# Patient Record
Sex: Male | Born: 1965 | Race: White | Hispanic: No | Marital: Married | Smoking: Former smoker
Health system: Southern US, Community
[De-identification: ages and names within clinical notes are randomized; demographics above are authoritative.]

## PROBLEM LIST (undated history)

## (undated) DIAGNOSIS — M7520 Bicipital tendinitis, unspecified shoulder: Secondary | ICD-10-CM

## (undated) DIAGNOSIS — E785 Hyperlipidemia, unspecified: Secondary | ICD-10-CM

## (undated) DIAGNOSIS — G4730 Sleep apnea, unspecified: Secondary | ICD-10-CM

## (undated) DIAGNOSIS — N200 Calculus of kidney: Secondary | ICD-10-CM

## (undated) DIAGNOSIS — R51 Headache: Secondary | ICD-10-CM

## (undated) DIAGNOSIS — K635 Polyp of colon: Secondary | ICD-10-CM

## (undated) DIAGNOSIS — R519 Headache, unspecified: Secondary | ICD-10-CM

## (undated) HISTORY — PX: LITHOTRIPSY: SUR834

## (undated) HISTORY — PX: URETEROSCOPY WITH HOLMIUM LASER LITHOTRIPSY: SHX6645

---

## 2009-08-19 ENCOUNTER — Ambulatory Visit: Payer: Self-pay | Admitting: Internal Medicine

## 2009-08-27 ENCOUNTER — Ambulatory Visit: Payer: Self-pay | Admitting: Urology

## 2009-09-09 ENCOUNTER — Ambulatory Visit: Payer: Self-pay | Admitting: General Practice

## 2009-09-11 ENCOUNTER — Ambulatory Visit: Payer: Self-pay | Admitting: Urology

## 2009-09-30 ENCOUNTER — Ambulatory Visit: Payer: Self-pay | Admitting: Urology

## 2009-11-21 ENCOUNTER — Ambulatory Visit: Payer: Self-pay | Admitting: Urology

## 2009-12-24 ENCOUNTER — Ambulatory Visit: Payer: Self-pay | Admitting: Urology

## 2009-12-26 ENCOUNTER — Ambulatory Visit: Payer: Self-pay | Admitting: Urology

## 2009-12-29 ENCOUNTER — Ambulatory Visit: Payer: Self-pay | Admitting: Urology

## 2010-01-02 ENCOUNTER — Ambulatory Visit: Payer: Self-pay | Admitting: Urology

## 2010-11-17 IMAGING — CR DG ABDOMEN 1V
1 series · 2 of 2 positions shown · non-contrast
Comparison: none

REASON FOR EXAM: nephrolithasis
COMMENTS:

[Series 1: view not recorded · 0.17mm/px · 2 of 2 slices shown]
[im 1/2]
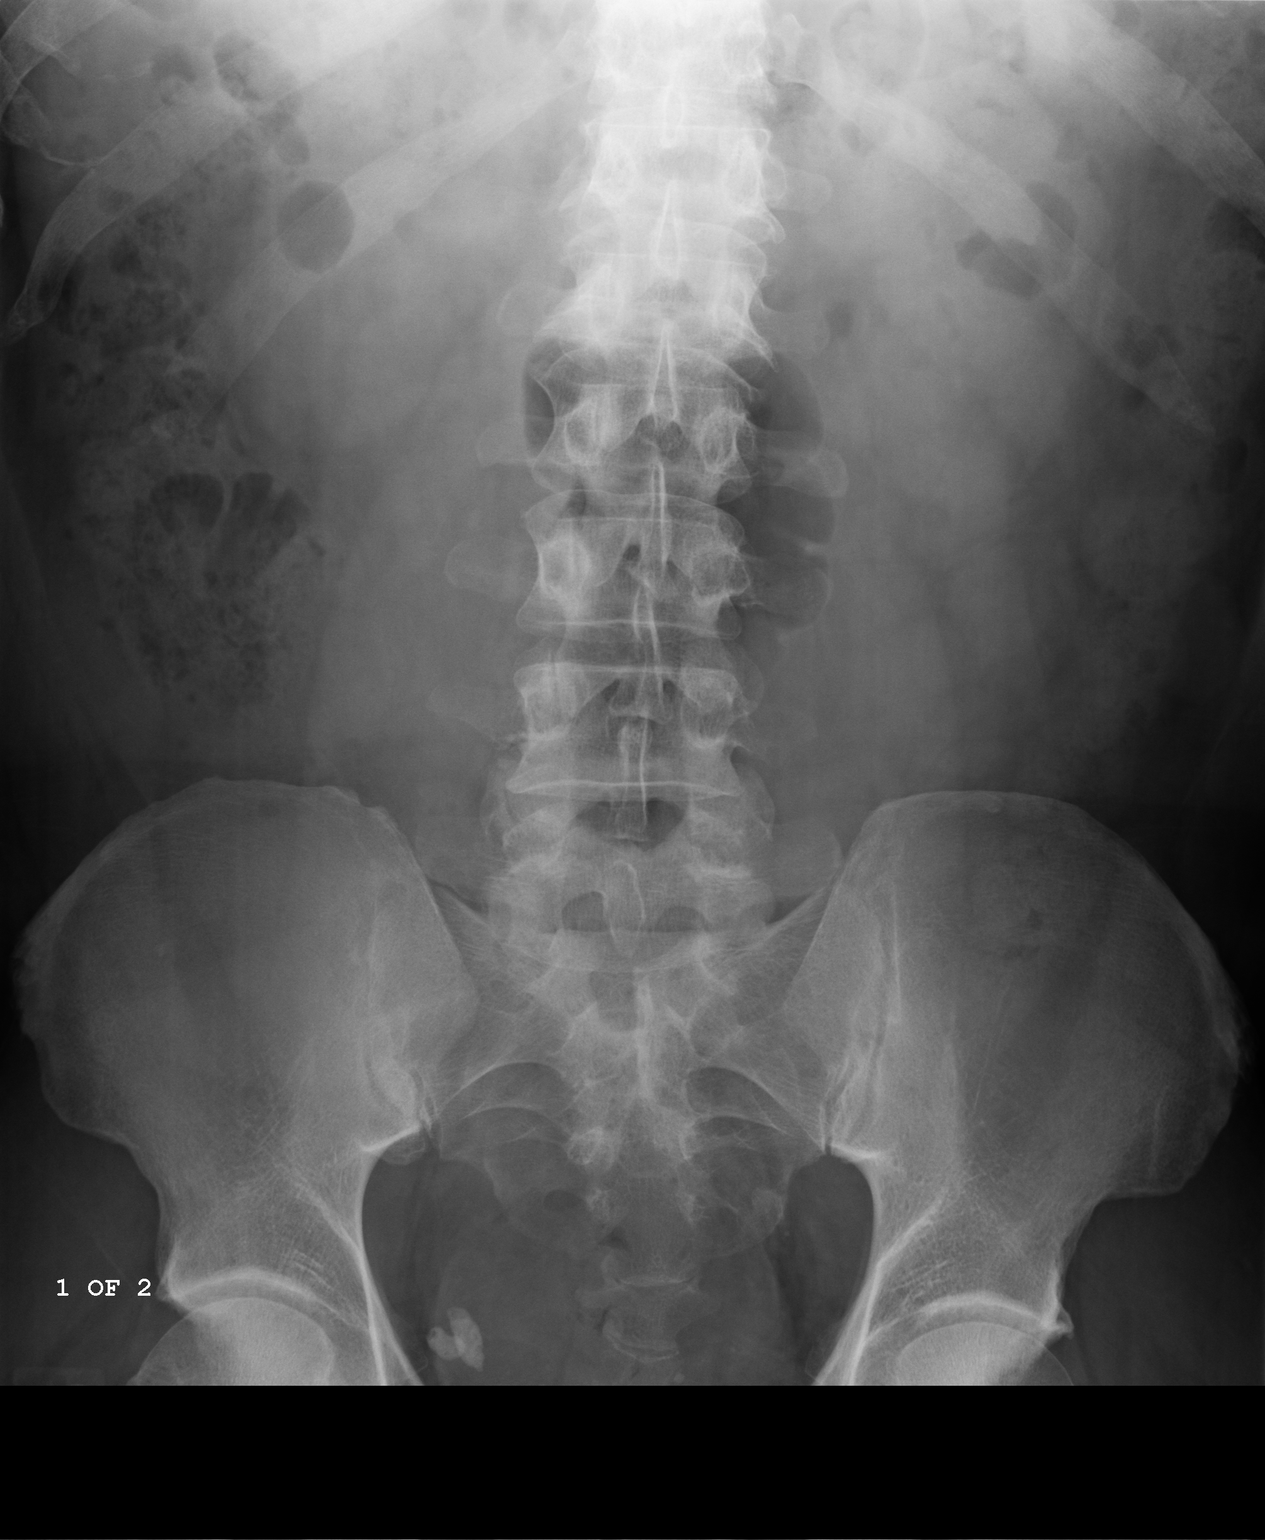
[im 2/2]
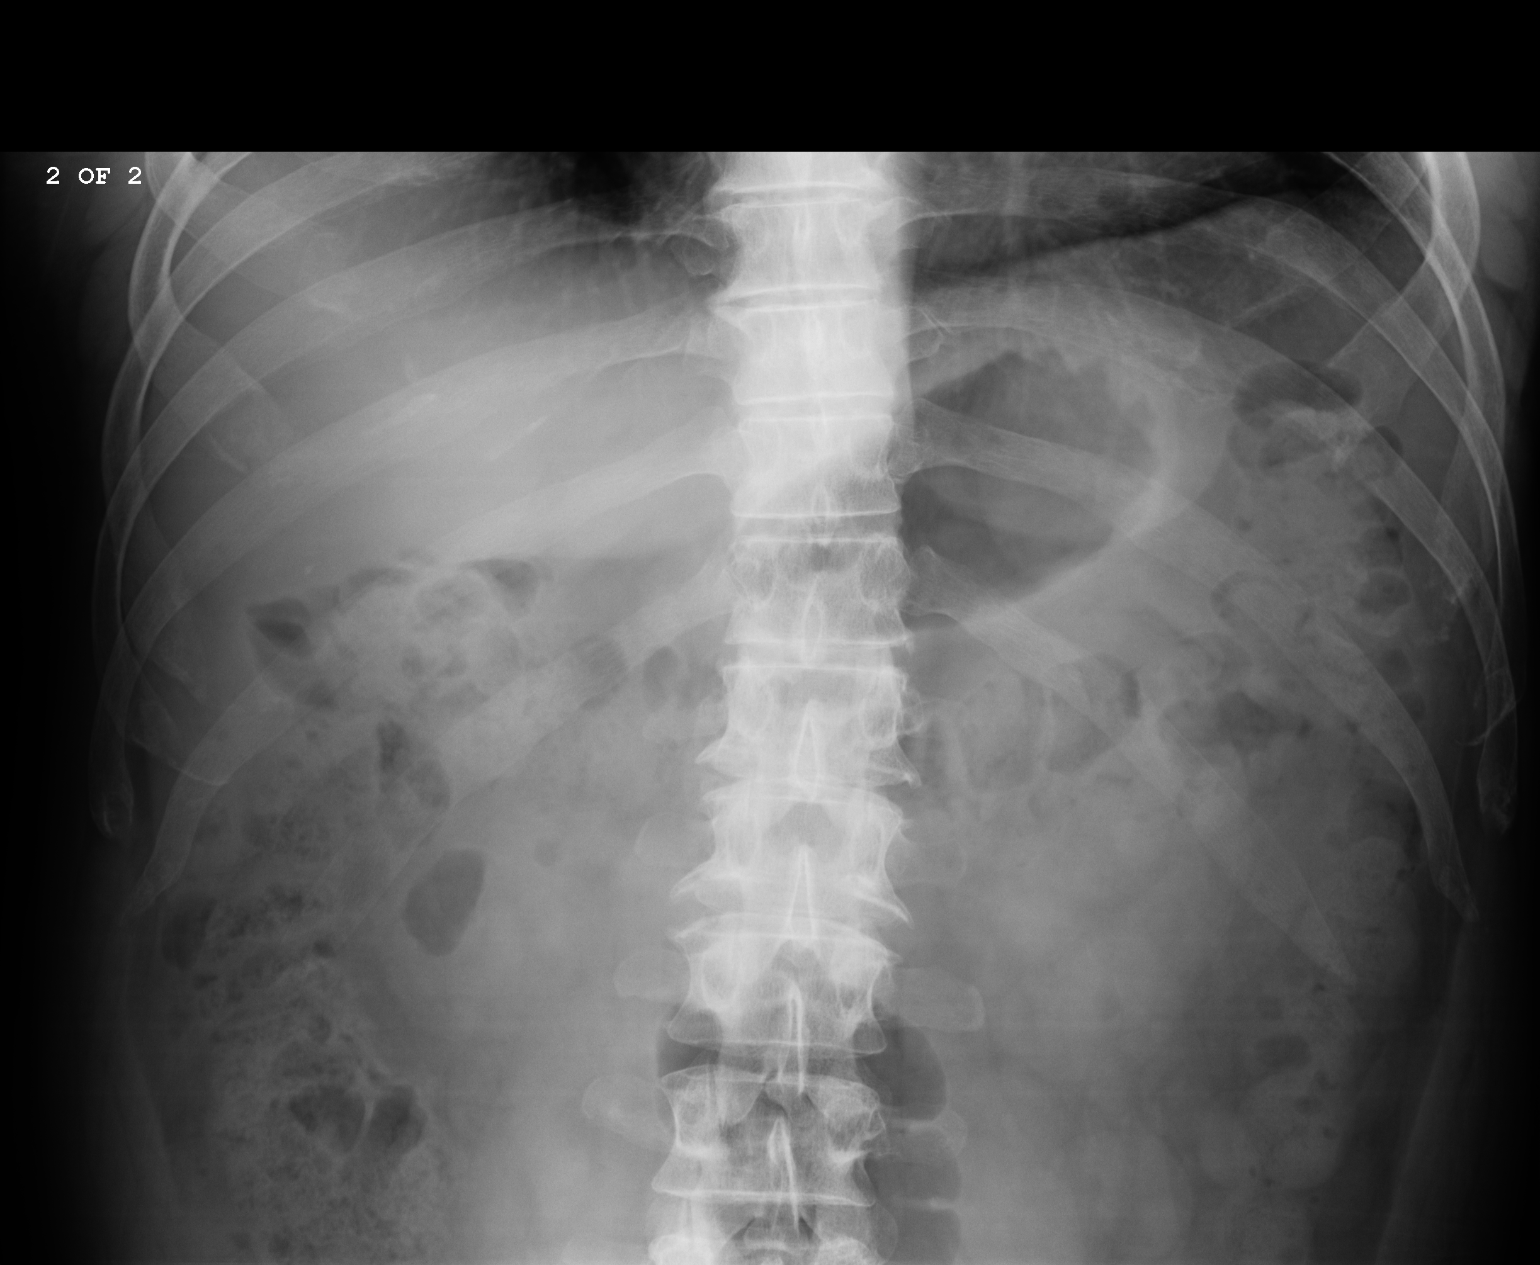

[2 of 2 positions shown; findings below may reference images not displayed]

PROCEDURE:     DXR - DXR KIDNEY URETER BLADDER  - September 30, 2009  [DATE]

RESULT:     Calcifications over the lower mid pelvis and right lower
quadrant are stable. There is no nephrolithiasis evident. Degenerative
changes are seen in the thoracolumbar region. There appears to be a possible
mid right ureteral calculus between the transverse processes of the L2-L3
level. This could represent superimposed artifact.
IMPRESSION: Cannot exclude a right mid ureteral calculus. Correlate clinically. A distal
right ureteral calculus would also be difficult to completely exclude. There
are numerous pelvic calcifications present one of which in the distal right
ureter region may be new.

## 2011-02-12 IMAGING — CT CT ABD-PELV W/O CM
1 of 2 series · 15 of 32 positions shown, 19 images · non-contrast
Comparison: none

REASON FOR EXAM: renal colic
COMMENTS:

PROCEDURE:     KCT - KCT ABDOMEN/PELVIS WO  - December 26, 2009 [DATE]
RESULT:     Comparison is made to a prior study dated 08/19/2009.
TECHNIQUE: Helical noncontrast 3 mm sections were obtained from the lung
bases through the pubic symphysis.

[Series 2: stone 3.0 i40f · axial · 0.93mm/px · z∈[-651,-195]mm · 15 of 172 slices shown, 19 images]
[im 13/172  soft-tissue]
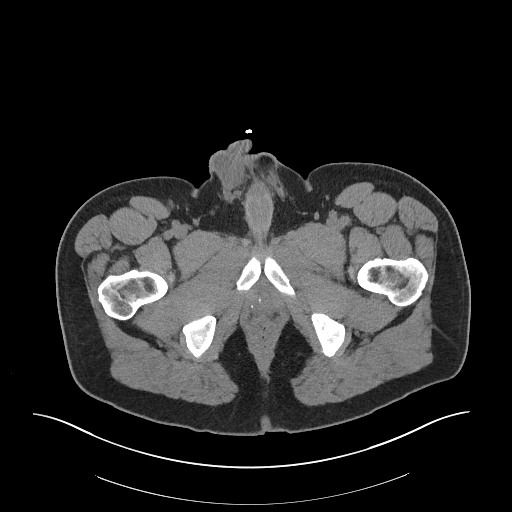
[im 13/172  bone]
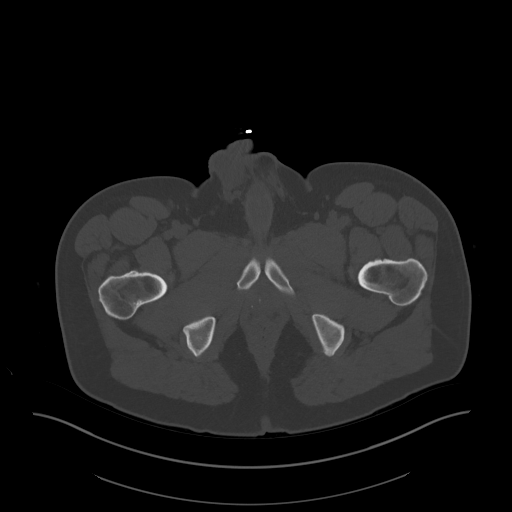
[im 26/172  soft-tissue]
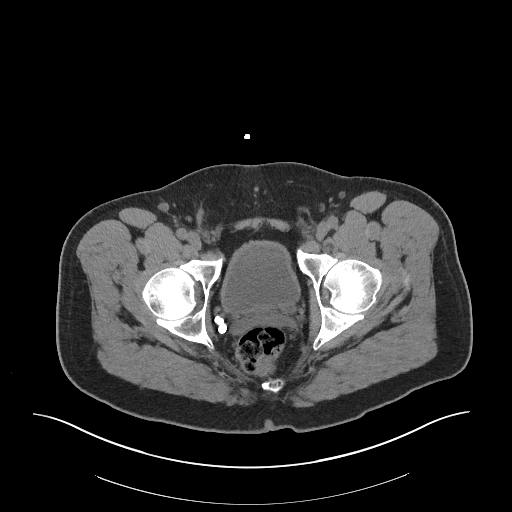
[im 39/172  soft-tissue]
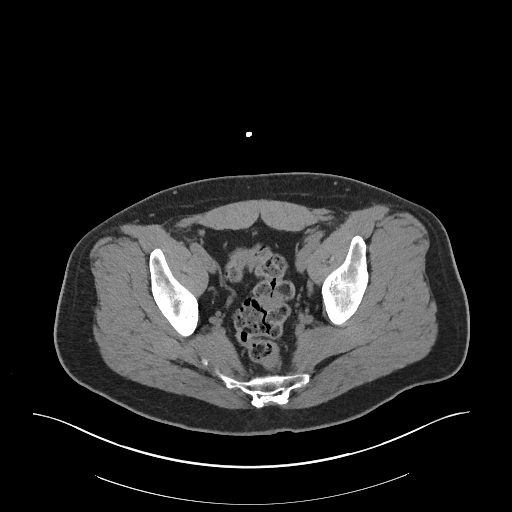
[im 51/172  soft-tissue]
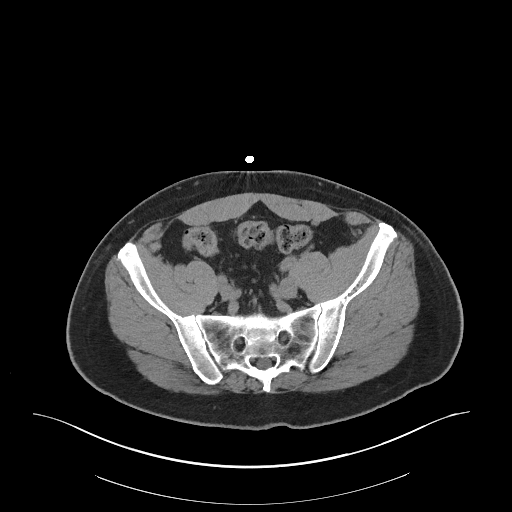
[im 64/172  soft-tissue]
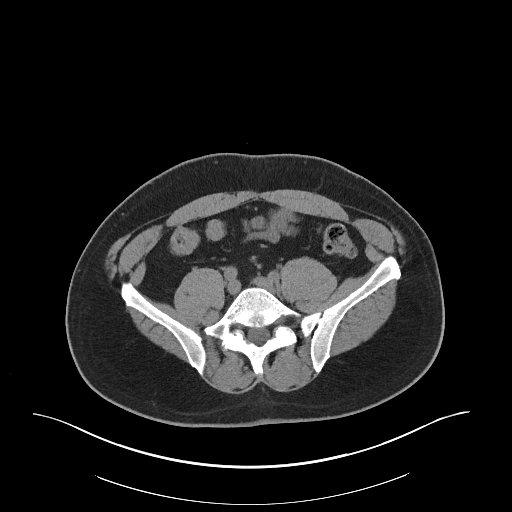
[im 77/172  soft-tissue]
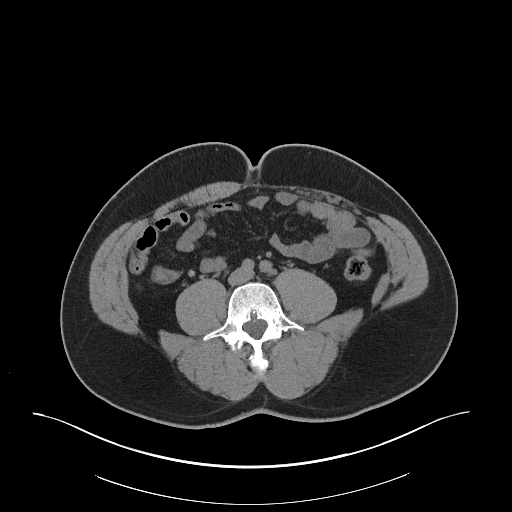
[im 89/172  soft-tissue]
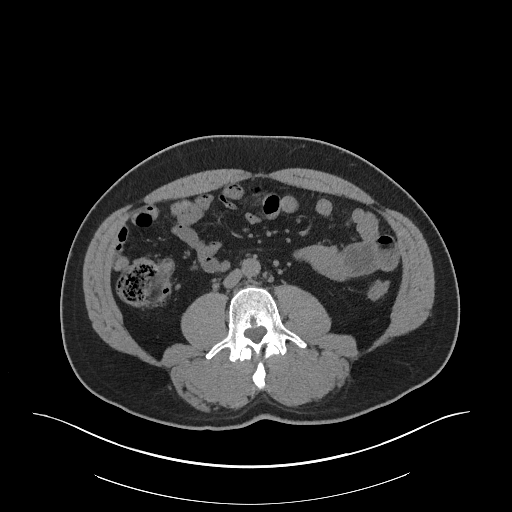
[im 102/172  soft-tissue]
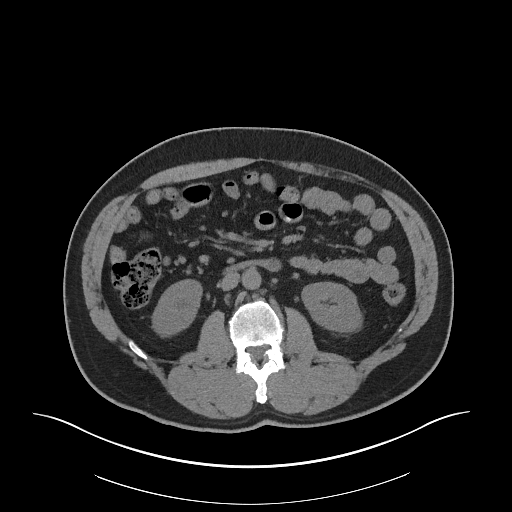
[im 115/172  soft-tissue]
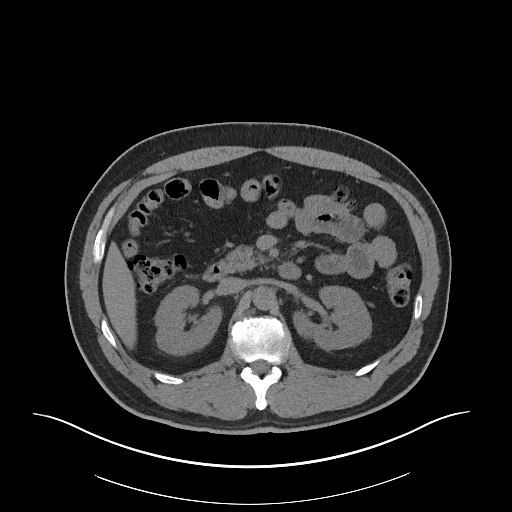
[im 115/172  bone]
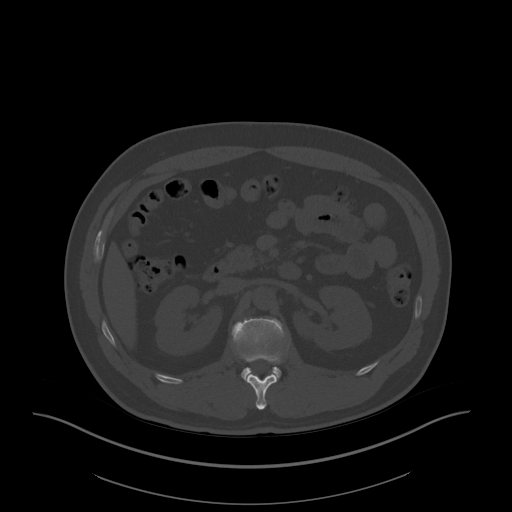
[im 127/172  soft-tissue]
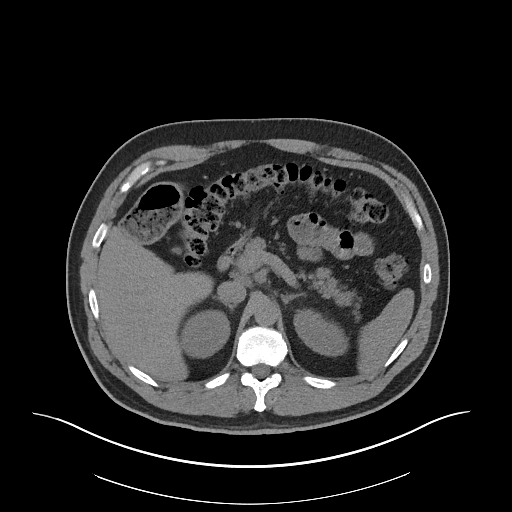
[im 140/172  soft-tissue]
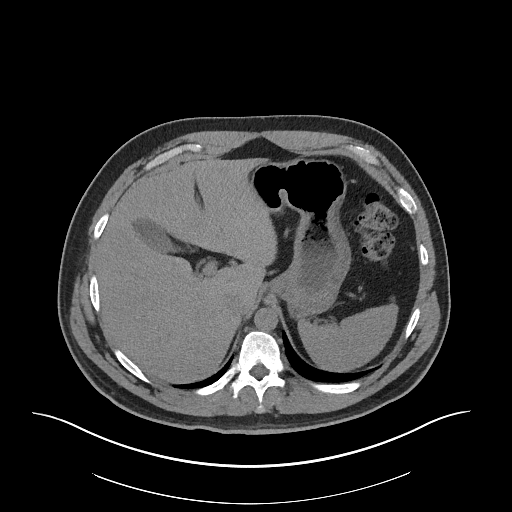
[im 146/172  lung]
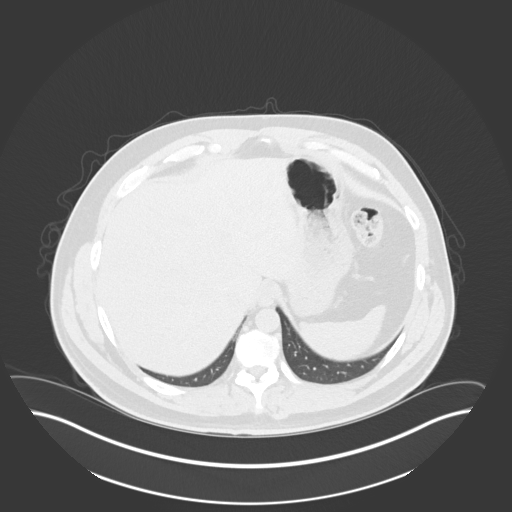
[im 153/172  soft-tissue]
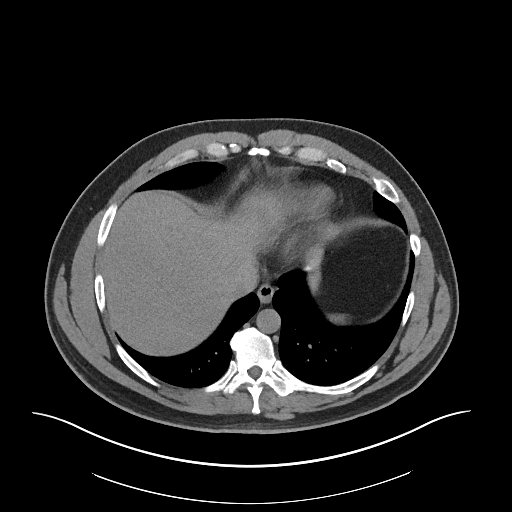
[im 153/172  lung]
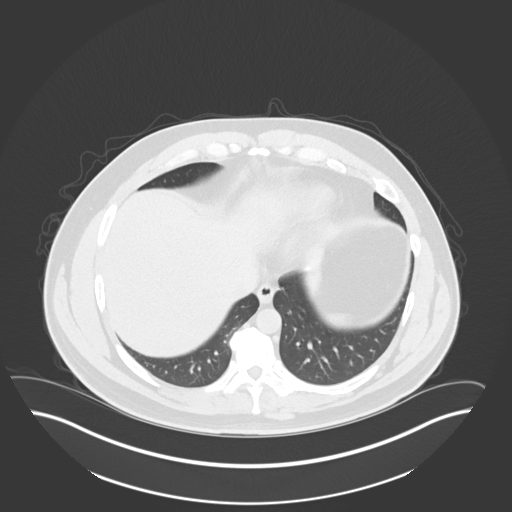
[im 159/172  lung]
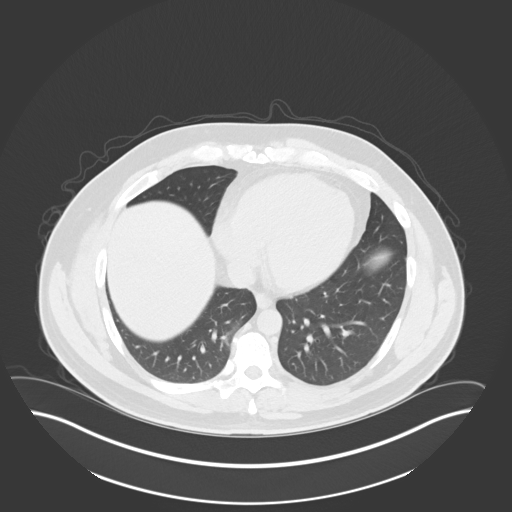
[im 165/172  soft-tissue]
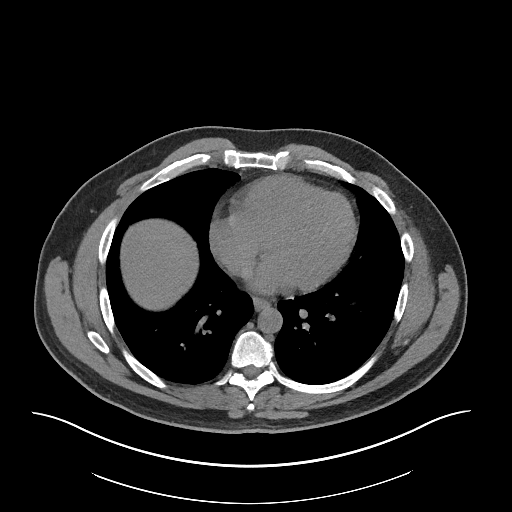
[im 165/172  lung]
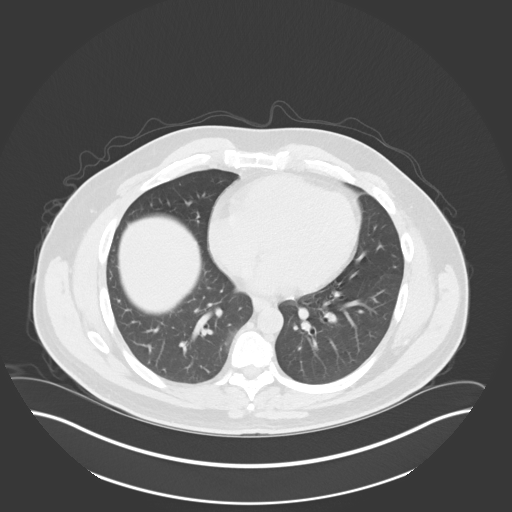

[15 of 32 positions shown; findings below may reference images not displayed]

FINDINGS: Evaluation of the lung bases demonstrates no gross abnormalities.

Evaluation of the right kidney demonstrates no evidence of hydronephrosis.
Within the mid right ureter, a 3.7 mm calculus is appreciated. There is mild
ureteral prominence. There is no evidence of renal masses or further
calculus disease on the right. The left kidney demonstrates a
nonobstructing, 4.3 mm medullary calculus within the lower pole. There is no
evidence of hydronephrosis, hydroureter or ureterolithiasis.

Noncontrast evaluation of the liver, spleen, adrenals and pancreas is
unremarkable. There is no CT evidence of bowel obstruction or secondary
signs reflecting enteritis, colitis, diverticulitis or appendicitis. Small
punctate foci of increased density are appreciated within the appendix
possibly representing small appendicoliths. There is no evidence of an
abdominal aortic aneurysm.
IMPRESSION: 1.  Mid right ureteral calculus without significant associated obstructive
uropathy.
2.  Nonobstructing medullary calculus on the left.
3.  No further evidence of obstructive or inflammatory abnormalities.

## 2011-08-13 ENCOUNTER — Emergency Department: Payer: Self-pay | Admitting: Emergency Medicine

## 2011-08-13 LAB — CBC
HCT: 43.1 % (ref 40.0–52.0)
MCH: 31 pg (ref 26.0–34.0)
MCHC: 34.2 g/dL (ref 32.0–36.0)
Platelet: 213 10*3/uL (ref 150–440)
RDW: 13.3 % (ref 11.5–14.5)
WBC: 5.3 10*3/uL (ref 3.8–10.6)

## 2011-08-13 LAB — BASIC METABOLIC PANEL
Anion Gap: 9 (ref 7–16)
Calcium, Total: 8.6 mg/dL (ref 8.5–10.1)
Chloride: 107 mmol/L (ref 98–107)
Co2: 26 mmol/L (ref 21–32)
Creatinine: 0.86 mg/dL (ref 0.60–1.30)
Potassium: 3.9 mmol/L (ref 3.5–5.1)
Sodium: 142 mmol/L (ref 136–145)

## 2011-08-13 LAB — TROPONIN I: Troponin-I: 0.06 ng/mL — ABNORMAL HIGH

## 2013-04-26 DIAGNOSIS — N200 Calculus of kidney: Secondary | ICD-10-CM

## 2013-04-26 HISTORY — DX: Calculus of kidney: N20.0

## 2015-09-19 ENCOUNTER — Encounter: Payer: Self-pay | Admitting: *Deleted

## 2015-09-20 ENCOUNTER — Ambulatory Visit: Payer: BLUE CROSS/BLUE SHIELD | Admitting: Anesthesiology

## 2015-09-20 ENCOUNTER — Ambulatory Visit
Admission: RE | Admit: 2015-09-20 | Discharge: 2015-09-20 | Disposition: A | Discharge status: Home / Self Care | Payer: BLUE CROSS/BLUE SHIELD | Source: Ambulatory Visit | Attending: Unknown Physician Specialty | Admitting: Unknown Physician Specialty

## 2015-09-20 ENCOUNTER — Encounter
Admission: RE | Disposition: A | Discharge status: Home / Self Care | Payer: Self-pay | Source: Ambulatory Visit | Attending: Unknown Physician Specialty

## 2015-09-20 DIAGNOSIS — D124 Benign neoplasm of descending colon: Secondary | ICD-10-CM | POA: Diagnosis not present

## 2015-09-20 DIAGNOSIS — K648 Other hemorrhoids: Secondary | ICD-10-CM | POA: Insufficient documentation

## 2015-09-20 DIAGNOSIS — E785 Hyperlipidemia, unspecified: Secondary | ICD-10-CM | POA: Insufficient documentation

## 2015-09-20 DIAGNOSIS — D120 Benign neoplasm of cecum: Secondary | ICD-10-CM | POA: Diagnosis not present

## 2015-09-20 DIAGNOSIS — G4730 Sleep apnea, unspecified: Secondary | ICD-10-CM | POA: Diagnosis not present

## 2015-09-20 DIAGNOSIS — D125 Benign neoplasm of sigmoid colon: Secondary | ICD-10-CM | POA: Insufficient documentation

## 2015-09-20 DIAGNOSIS — Z1211 Encounter for screening for malignant neoplasm of colon: Secondary | ICD-10-CM | POA: Diagnosis not present

## 2015-09-20 HISTORY — DX: Sleep apnea, unspecified: G47.30

## 2015-09-20 HISTORY — DX: Bicipital tendinitis, unspecified shoulder: M75.20

## 2015-09-20 HISTORY — DX: Headache, unspecified: R51.9

## 2015-09-20 HISTORY — DX: Calculus of kidney: N20.0

## 2015-09-20 HISTORY — PX: COLONOSCOPY WITH PROPOFOL: SHX5780

## 2015-09-20 HISTORY — DX: Headache: R51

## 2015-09-20 HISTORY — DX: Hyperlipidemia, unspecified: E78.5

## 2015-09-20 SURGERY — COLONOSCOPY WITH PROPOFOL
Anesthesia: General

## 2015-09-20 MED ORDER — SODIUM CHLORIDE 0.9 % IV SOLN: INTRAVENOUS | Status: DC

## 2015-09-20 MED ORDER — PROPOFOL 500 MG/50ML IV EMUL
INTRAVENOUS | Status: DC | PRN
  Administered 2015-09-20: 140 ug/kg/min via INTRAVENOUS

## 2015-09-20 MED ORDER — MIDAZOLAM HCL 2 MG/2ML IJ SOLN
INTRAMUSCULAR | Status: DC | PRN
  Administered 2015-09-20 (×2): 1 mg via INTRAVENOUS

## 2015-09-20 MED ORDER — SODIUM CHLORIDE 0.9 % IV SOLN
INTRAVENOUS | Status: DC
  Administered 2015-09-20: 1000 mL via INTRAVENOUS

## 2015-09-20 MED ORDER — PROPOFOL 10 MG/ML IV BOLUS
INTRAVENOUS | Status: DC | PRN
  Administered 2015-09-20: 30 mg via INTRAVENOUS
  Administered 2015-09-20 (×2): 20 mg via INTRAVENOUS

## 2015-09-20 NOTE — Anesthesia Preprocedure Evaluation (Signed)
Anesthesia Evaluation  Patient identified by MRN, date of birth, ID band Patient awake    Reviewed: Allergy & Precautions, NPO status , Patient's Chart, lab work & pertinent test results  History of Anesthesia Complications Negative for: history of anesthetic complications  Airway Mallampati: II  TM Distance: >3 FB Neck ROM: Full    Dental no notable dental hx.    Pulmonary sleep apnea and Continuous Positive Airway Pressure Ventilation , neg COPD,    breath sounds clear to auscultation- rhonchi (-) wheezing      Cardiovascular Exercise Tolerance: Good (-) hypertension(-) CAD and (-) Past MI  Rhythm:Regular Rate:Normal - Systolic murmurs and - Diastolic murmurs    Neuro/Psych  Headaches, negative psych ROS   GI/Hepatic negative GI ROS, Neg liver ROS,   Endo/Other  negative endocrine ROSneg diabetes  Renal/GU negative Renal ROS     Musculoskeletal  (+) Arthritis , Osteoarthritis,    Abdominal (+) + obese,   Peds  Hematology negative hematology ROS (+)   Anesthesia Other Findings   Reproductive/Obstetrics                             Anesthesia Physical Anesthesia Plan  ASA: II  Anesthesia Plan: General   Post-op Pain Management:    Induction: Intravenous  Airway Management Planned: Natural Airway  Additional Equipment:   Intra-op Plan:   Post-operative Plan:   Informed Consent: I have reviewed the patients History and Physical, chart, labs and discussed the procedure including the risks, benefits and alternatives for the proposed anesthesia with the patient or authorized representative who has indicated his/her understanding and acceptance.   Dental advisory given  Plan Discussed with: CRNA and Anesthesiologist  Anesthesia Plan Comments:         Anesthesia Quick Evaluation

## 2015-09-20 NOTE — Op Note (Signed)
[facility] Gastroenterology Patient Name: Derrick Henderson Procedure Date: 09/20/2015 3:26 PM MRN: [id number] Account #: [id number] Date of Birth: 27-Sep-1965 Admit Type: Outpatient Age: 50 Room: [facility] ENDO ROOM 4 Gender: Male Note Status: Finalized Procedure:            Colonoscopy Indications:          Screening for colorectal malignant neoplasm Providers:            Manya Silvas, MD Referring MD:         Irven Easterly. Kary Kos, MD (Referring MD) Medicines:            Propofol per Anesthesia Complications:        No immediate complications. Procedure:            Pre-Anesthesia Assessment:                       - After reviewing the risks and benefits, the patient                        was deemed in satisfactory condition to undergo the                        procedure.                       After obtaining informed consent, the colonoscope was                        passed under direct vision. Throughout the procedure,                        the patient's blood pressure, pulse, and oxygen                        saturations were monitored continuously. The                        Colonoscope was introduced through the anus and                        advanced to the the cecum, identified by appendiceal                        orifice and ileocecal valve. The colonoscopy was                        performed without difficulty. The patient tolerated the                        procedure well. The quality of the bowel preparation                        was good. Findings:      A small polyp was found in the proximal ascending colon. The polyp was       sessile. The polyp was removed with a cold snare. Resection and       retrieval were complete.      A diminutive polyp was found in the cecum. The polyp was sessile. The       polyp was removed with a jumbo cold forceps. Resection and retrieval  were complete.      A diminutive polyp was found in the hepatic  flexure. The polyp was       sessile. The polyp was removed with a jumbo cold forceps. Resection and       retrieval were complete.      A diminutive polyp was found in the descending colon. The polyp was       sessile. The polyp was removed with a jumbo cold forceps. Resection and       retrieval were complete.      A diminutive polyp was found in the sigmoid colon. The polyp was       sessile. The polyp was removed with a jumbo cold forceps. Resection and       retrieval were complete.      Two sessile polyps were found in the rectum. The polyps were diminutive       in size. These polyps were removed with a jumbo cold forceps. Resection       and retrieval were complete.      Internal hemorrhoids were found during endoscopy. The hemorrhoids were       small and Grade I (internal hemorrhoids that do not prolapse).      The exam was otherwise without abnormality. Impression:           - One small polyp in the proximal ascending colon,                        removed with a cold snare. Resected and retrieved.                       - One diminutive polyp in the cecum, removed with a                        jumbo cold forceps. Resected and retrieved.                       - One diminutive polyp at the hepatic flexure, removed                        with a jumbo cold forceps. Resected and retrieved.                       - One diminutive polyp in the descending colon, removed                        with a jumbo cold forceps. Resected and retrieved.                       - One diminutive polyp in the sigmoid colon, removed                        with a jumbo cold forceps. Resected and retrieved.                       - Two diminutive polyps in the rectum, removed with a                        jumbo cold forceps. Resected and retrieved.                       -  Internal hemorrhoids.                       - The examination was otherwise normal. Recommendation:       - Await pathology  results. Manya Silvas, MD 09/20/2015 4:12:10 PM This report has been signed electronically. Number of Addenda: 0 Note Initiated On: 09/20/2015 3:26 PM Scope Withdrawal Time: 0 hours 23 minutes 19 seconds  Total Procedure Duration: 0 hours 27 minutes 49 seconds       [facility]

## 2015-09-20 NOTE — Transfer of Care (Signed)
Immediate Anesthesia Transfer of Care Note  Patient: Derrick Henderson  Procedure(s) Performed: Procedure(s): COLONOSCOPY WITH PROPOFOL (N/A)  Patient Location: PACU  Anesthesia Type:General  Level of Consciousness: sedated  Airway & Oxygen Therapy: Patient Spontanous Breathing and Patient connected to nasal cannula oxygen  Post-op Assessment: Report given to RN and Post -op Vital signs reviewed and stable  Post vital signs: Reviewed and stable  Last Vitals:  Vitals:   09/20/15 1438 09/20/15 1613  BP: (!) 126/108 133/82  Pulse: 86 (!) 106  Resp: 20 19  Temp: 37.2 C 36.2 C    Last Pain:  Vitals:   09/20/15 1613  TempSrc: Tympanic         Complications: No apparent anesthesia complications

## 2015-09-20 NOTE — Anesthesia Postprocedure Evaluation (Signed)
Anesthesia Post Note  Patient: Derrick Henderson  Procedure(s) Performed: Procedure(s) (LRB): COLONOSCOPY WITH PROPOFOL (N/A)  Patient location during evaluation: Endoscopy Anesthesia Type: General Level of consciousness: awake and alert Pain management: pain level controlled Vital Signs Assessment: post-procedure vital signs reviewed and stable Respiratory status: spontaneous breathing, nonlabored ventilation, respiratory function stable and patient connected to nasal cannula oxygen Cardiovascular status: blood pressure returned to baseline and stable Postop Assessment: no signs of nausea or vomiting Anesthetic complications: no    Last Vitals:  Vitals:   09/20/15 1438 09/20/15 1613  BP: (!) 126/108 133/82  Pulse: 86 (!) 106  Resp: 20 18  Temp: 37.2 C 36.2 C    Last Pain:  Vitals:   09/20/15 1613  TempSrc: Tympanic                 Precious Haws Piscitello

## 2015-09-20 NOTE — Anesthesia Procedure Notes (Signed)
Date/Time: 09/20/2015 3:34 PM Performed by: Johnna Acosta Pre-anesthesia Checklist: Patient identified, Emergency Drugs available, Suction available, Patient being monitored and Timeout performed Patient Re-evaluated:Patient Re-evaluated prior to inductionOxygen Delivery Method: Nasal cannula

## 2015-09-20 NOTE — H&P (Signed)
   Primary Care Physician:  Maryland Pink, MD Primary Gastroenterologist:  Dr. Vira Agar  Pre-Procedure History & Physical: HPI:  Derrick Henderson is a 50 y.o. male is here for an colonoscopy.   Past Medical History:  Diagnosis Date  . Biceps tendinitis   . Headache    migraine  . Hyperlipidemia   . Renal calculi 04/26/2013  . Sleep apnea    uses CPAP    History reviewed. No pertinent surgical history.  Prior to Admission medications   Medication Sig Start Date End Date Taking? Authorizing Provider  acetaminophen (TYLENOL) 500 MG tablet Take by mouth. Take 1,500 mg by mouth as needed for pain   Yes Historical Provider, MD  co-enzyme Q-10 30 MG capsule Take by mouth daily.   Yes Historical Provider, MD  cyclobenzaprine (FLEXERIL) 10 MG tablet Take 10 mg by mouth 3 (three) times daily as needed for muscle spasms.   Yes Historical Provider, MD  Multiple Vitamin (MULTIVITAMIN) tablet Take 1 tablet by mouth daily.   Yes Historical Provider, MD  naproxen (NAPROSYN) 500 MG tablet Take 500 mg by mouth 2 (two) times daily with a meal.   Yes Historical Provider, MD  pyridOXINE (B-6) 50 MG tablet Take 50 mg by mouth daily.   Yes Historical Provider, MD  SELENIUM PO Take by mouth daily.   Yes Historical Provider, MD  vitamin B-12 (CYANOCOBALAMIN) 1000 MCG tablet Take 1,000 mcg by mouth daily.   Yes Historical Provider, MD    Allergies as of 08/28/2015  . (Not on File)    History reviewed. No pertinent family history.  Social History   Social History  . Marital status: Married    Spouse name: N/A  . Number of children: N/A  . Years of education: N/A   Occupational History  . Not on file.   Social History Main Topics  . Smoking status: Never Smoker  . Smokeless tobacco: Never Used  . Alcohol use No  . Drug use: No  . Sexual activity: Not on file   Other Topics Concern  . Not on file   Social History Narrative  . No narrative on file    Review of Systems: See HPI,  otherwise negative ROS  Physical Exam: BP (!) 126/108   Pulse 86   Temp 98.9 F (37.2 C) (Tympanic)   Resp 20   Ht 5' 11.5" (1.816 m)   Wt 100.2 kg (221 lb)   SpO2 98%   BMI 30.39 kg/m  General:   Alert,  pleasant and cooperative in NAD Head:  Normocephalic and atraumatic. Neck:  Supple; no masses or thyromegaly. Lungs:  Clear throughout to auscultation.    Heart:  Regular rate and rhythm. Abdomen:  Soft, nontender and nondistended. Normal bowel sounds, without guarding, and without rebound.   Neurologic:  Alert and  oriented x4;  grossly normal neurologically.  Impression/Plan: Derrick Henderson is here for an colonoscopy to be performed for screening colonoscopy  Risks, benefits, limitations, and alternatives regarding  colonoscopy have been reviewed with the patient.  Questions have been answered.  All parties agreeable.   Gaylyn Cheers, MD  09/20/2015, 3:31 PM

## 2015-09-24 ENCOUNTER — Encounter: Payer: Self-pay | Admitting: Unknown Physician Specialty

## 2015-09-26 LAB — SURGICAL PATHOLOGY

## 2018-11-25 ENCOUNTER — Other Ambulatory Visit: Payer: Self-pay

## 2018-11-25 ENCOUNTER — Other Ambulatory Visit
Admission: RE | Admit: 2018-11-25 | Discharge: 2018-11-25 | Disposition: A | Discharge status: Home / Self Care | Payer: BLUE CROSS/BLUE SHIELD | Source: Ambulatory Visit | Attending: Internal Medicine | Admitting: Internal Medicine

## 2018-11-25 DIAGNOSIS — Z01812 Encounter for preprocedural laboratory examination: Secondary | ICD-10-CM | POA: Insufficient documentation

## 2018-11-25 DIAGNOSIS — Z20828 Contact with and (suspected) exposure to other viral communicable diseases: Secondary | ICD-10-CM | POA: Insufficient documentation

## 2018-11-25 LAB — SARS CORONAVIRUS 2 (TAT 6-24 HRS): SARS Coronavirus 2: NEGATIVE

## 2018-11-29 ENCOUNTER — Encounter: Payer: Self-pay | Admitting: *Deleted

## 2018-11-30 ENCOUNTER — Ambulatory Visit: Payer: BLUE CROSS/BLUE SHIELD | Admitting: Certified Registered Nurse Anesthetist

## 2018-11-30 ENCOUNTER — Ambulatory Visit
Admission: RE | Admit: 2018-11-30 | Discharge: 2018-11-30 | Disposition: A | Discharge status: Home / Self Care | Payer: BLUE CROSS/BLUE SHIELD | Attending: Internal Medicine | Admitting: Internal Medicine

## 2018-11-30 ENCOUNTER — Other Ambulatory Visit: Payer: Self-pay

## 2018-11-30 ENCOUNTER — Encounter
Admission: RE | Disposition: A | Discharge status: Home / Self Care | Payer: Self-pay | Source: Home / Self Care | Attending: Internal Medicine

## 2018-11-30 DIAGNOSIS — Z86010 Personal history of colonic polyps: Secondary | ICD-10-CM | POA: Insufficient documentation

## 2018-11-30 DIAGNOSIS — Z79899 Other long term (current) drug therapy: Secondary | ICD-10-CM | POA: Diagnosis not present

## 2018-11-30 DIAGNOSIS — Z886 Allergy status to analgesic agent status: Secondary | ICD-10-CM | POA: Diagnosis not present

## 2018-11-30 DIAGNOSIS — G43909 Migraine, unspecified, not intractable, without status migrainosus: Secondary | ICD-10-CM | POA: Diagnosis not present

## 2018-11-30 DIAGNOSIS — Z1211 Encounter for screening for malignant neoplasm of colon: Secondary | ICD-10-CM | POA: Insufficient documentation

## 2018-11-30 DIAGNOSIS — G4730 Sleep apnea, unspecified: Secondary | ICD-10-CM | POA: Insufficient documentation

## 2018-11-30 HISTORY — PX: COLONOSCOPY WITH PROPOFOL: SHX5780

## 2018-11-30 HISTORY — DX: Polyp of colon: K63.5

## 2018-11-30 SURGERY — COLONOSCOPY WITH PROPOFOL
Anesthesia: General

## 2018-11-30 MED ORDER — PROPOFOL 10 MG/ML IV BOLUS
INTRAVENOUS | Status: DC | PRN
  Administered 2018-11-30: 20 mg via INTRAVENOUS
  Administered 2018-11-30: 30 mg via INTRAVENOUS
  Administered 2018-11-30: 70 mg via INTRAVENOUS

## 2018-11-30 MED ORDER — LIDOCAINE HCL (CARDIAC) PF 100 MG/5ML IV SOSY
PREFILLED_SYRINGE | INTRAVENOUS | Status: DC | PRN
  Administered 2018-11-30: 50 mg via INTRATRACHEAL

## 2018-11-30 MED ORDER — PROPOFOL 500 MG/50ML IV EMUL
INTRAVENOUS | Status: DC | PRN
  Administered 2018-11-30: 150 ug/kg/min via INTRAVENOUS

## 2018-11-30 MED ORDER — PROPOFOL 10 MG/ML IV BOLUS
INTRAVENOUS | Status: AC
  Filled 2018-11-30: qty 20

## 2018-11-30 MED ORDER — SODIUM CHLORIDE 0.9 % IV SOLN
INTRAVENOUS | Status: DC
  Administered 2018-11-30: 13:00:00 via INTRAVENOUS

## 2018-11-30 MED ORDER — LIDOCAINE HCL (PF) 2 % IJ SOLN
INTRAMUSCULAR | Status: AC
  Filled 2018-11-30: qty 10

## 2018-11-30 NOTE — Anesthesia Postprocedure Evaluation (Signed)
Anesthesia Post Note  Patient: Derrick Henderson  Procedure(s) Performed: COLONOSCOPY WITH PROPOFOL (N/A )  Patient location during evaluation: Endoscopy Anesthesia Type: General Level of consciousness: awake and alert and oriented Pain management: pain level controlled Vital Signs Assessment: post-procedure vital signs reviewed and stable Respiratory status: spontaneous breathing, nonlabored ventilation and respiratory function stable Cardiovascular status: blood pressure returned to baseline and stable Postop Assessment: no signs of nausea or vomiting Anesthetic complications: no     Last Vitals:  Vitals:   11/30/18 1415 11/30/18 1435  BP: (!) 139/93 (!) 148/87  Pulse:    Resp:    Temp:    SpO2:      Last Pain:  Vitals:   11/30/18 1435  TempSrc:   PainSc: 0-No pain                 Theophil Thivierge

## 2018-11-30 NOTE — Transfer of Care (Signed)
Immediate Anesthesia Transfer of Care Note  Patient: Derrick Henderson  Procedure(s) Performed: COLONOSCOPY WITH PROPOFOL (N/A )  Patient Location: PACU  Anesthesia Type:General  Level of Consciousness: awake, alert  and oriented  Airway & Oxygen Therapy: Patient Spontanous Breathing and Patient connected to nasal cannula oxygen  Post-op Assessment: Report given to RN and Post -op Vital signs reviewed and stable  Post vital signs: Reviewed and stable  Last Vitals:  Vitals Value Taken Time  BP 129/97 11/30/18 1407  Temp 36.3 C 11/30/18 1405  Pulse 119 11/30/18 1407  Resp 13 11/30/18 1407  SpO2 98 % 11/30/18 1407  Vitals shown include unvalidated device data.  Last Pain:  Vitals:   11/30/18 1405  TempSrc: Tympanic  PainSc: Asleep         Complications: No apparent anesthesia complications

## 2018-11-30 NOTE — Interval H&P Note (Signed)
History and Physical Interval Note:  11/30/2018 12:53 PM  Derrick Henderson  has presented today for surgery, with the diagnosis of PERSONAL HX.OF COLON POLYPS.  The various methods of treatment have been discussed with the patient and family. After consideration of risks, benefits and other options for treatment, the patient has consented to  Procedure(s): COLONOSCOPY WITH PROPOFOL (N/A) as a surgical intervention.  The patient's history has been reviewed, patient examined, no change in status, stable for surgery.  I have reviewed the patient's chart and labs.  Questions were answered to the patient's satisfaction.     Bernard, [place]

## 2018-11-30 NOTE — Anesthesia Preprocedure Evaluation (Signed)
Anesthesia Evaluation  Patient identified by MRN, date of birth, ID band Patient awake    Reviewed: Allergy & Precautions, NPO status , Patient's Chart, lab work & pertinent test results  History of Anesthesia Complications Negative for: history of anesthetic complications  Airway Mallampati: II  TM Distance: >3 FB Neck ROM: Full    Dental no notable dental hx.    Pulmonary sleep apnea and Continuous Positive Airway Pressure Ventilation , neg COPD,    breath sounds clear to auscultation- rhonchi (-) wheezing      Cardiovascular Exercise Tolerance: Good (-) hypertension(-) CAD, (-) Past MI, (-) Cardiac Stents and (-) CABG  Rhythm:Regular Rate:Normal - Systolic murmurs and - Diastolic murmurs    Neuro/Psych  Headaches, negative psych ROS   GI/Hepatic negative GI ROS, Neg liver ROS,   Endo/Other  negative endocrine ROSneg diabetes  Renal/GU Renal disease: hx of nephrolithiasis.     Musculoskeletal negative musculoskeletal ROS (+)   Abdominal (+) + obese,   Peds  Hematology negative hematology ROS (+)   Anesthesia Other Findings Past Medical History: No date: Biceps tendinitis No date: Colon polyp No date: Headache     Comment:  migraine No date: Hyperlipidemia 04/26/2013: Renal calculi No date: Sleep apnea     Comment:  uses CPAP   Reproductive/Obstetrics                             Anesthesia Physical Anesthesia Plan  ASA: II  Anesthesia Plan: General   Post-op Pain Management:    Induction: Intravenous  PONV Risk Score and Plan: 1 and Propofol infusion  Airway Management Planned: Natural Airway  Additional Equipment:   Intra-op Plan:   Post-operative Plan:   Informed Consent: I have reviewed the patients History and Physical, chart, labs and discussed the procedure including the risks, benefits and alternatives for the proposed anesthesia with the patient or authorized  representative who has indicated his/her understanding and acceptance.     Dental advisory given  Plan Discussed with: CRNA and Anesthesiologist  Anesthesia Plan Comments:         Anesthesia Quick Evaluation

## 2018-11-30 NOTE — H&P (Signed)
Outpatient short stay form Pre-procedure 11/30/2018 12:53 PM Will Heinkel K. Alice Reichert, M.D.  Primary Physician: Maryland Pink, M.D.  Reason for visit:  JPersonal hx of adenomatous colon polyps x 3 on colonoscopy performed on 09/20/2015  History of present illness:                            Patient presents for colonoscopy for a personal hx of colon polyps. The patient denies abdominal pain, abnormal weight loss or rectal bleeding.    No current facility-administered medications for this encounter.   Medications Prior to Admission  Medication Sig Dispense Refill Last Dose  . ketorolac (TORADOL) 10 MG tablet Take 10 mg by mouth every 6 (six) hours as needed.     Marland Kitchen losartan (COZAAR) 50 MG tablet Take 50 mg by mouth daily.     Marland Kitchen acetaminophen (TYLENOL) 500 MG tablet Take by mouth. Take 1,500 mg by mouth as needed for pain     . co-enzyme Q-10 30 MG capsule Take by mouth daily.     . cyclobenzaprine (FLEXERIL) 10 MG tablet Take 10 mg by mouth 3 (three) times daily as needed for muscle spasms.     . Multiple Vitamin (MULTIVITAMIN) tablet Take 1 tablet by mouth daily.     . naproxen (NAPROSYN) 500 MG tablet Take 500 mg by mouth 2 (two) times daily with a meal.     . pyridOXINE (B-6) 50 MG tablet Take 50 mg by mouth daily.     . SELENIUM PO Take by mouth daily.     . vitamin B-12 (CYANOCOBALAMIN) 1000 MCG tablet Take 1,000 mcg by mouth daily.        Allergies  Allergen Reactions  . Aspirin Hives     Past Medical History:  Diagnosis Date  . Biceps tendinitis   . Colon polyp   . Headache    migraine  . Hyperlipidemia   . Renal calculi 04/26/2013  . Sleep apnea    uses CPAP    Review of systems:  Otherwise negative.    Physical Exam  Gen: Alert, oriented. Appears stated age.  HEENT: [place]/AT. PERRLA. Lungs: CTA, no wheezes. CV: RR nl S1, S2. Abd: soft, benign, no masses. BS+ Ext: No edema. Pulses 2+    Planned procedures: Proceed with colonoscopy. The patient understands the  nature of the planned procedure, indications, risks, alternatives and potential complications including but not limited to bleeding, infection, perforation, damage to internal organs and possible oversedation/side effects from anesthesia. The patient agrees and gives consent to proceed.  Please refer to procedure notes for findings, recommendations and patient disposition/instructions.     Travone Georg K. Alice Reichert, M.D. Gastroenterology 11/30/2018  12:53 PM

## 2018-11-30 NOTE — Anesthesia Post-op Follow-up Note (Signed)
Anesthesia QCDR form completed.        

## 2018-11-30 NOTE — Op Note (Signed)
[facility] Patient Name: Derrick Henderson Procedure Date: 11/30/2018 1:37 PM MRN: [id number] Account #: [id number] Date of Birth: 08/18/1965 Admit Type: Outpatient Age: 53 Room: [facility] ENDO ROOM 3 Gender: Male Note Status: Finalized Procedure:             Colonoscopy Indications:           Screening for colorectal malignant neoplasm Providers:             Benay Pike. Toledo MD, MD Medicines:             Propofol per Anesthesia Complications:         No immediate complications. Procedure:             Pre-Anesthesia Assessment:                        - The risks and benefits of the procedure and the                         sedation options and risks were discussed with the                         patient. All questions were answered and informed                         consent was obtained.                        - Patient identification and proposed procedure were                         verified prior to the procedure by the nurse. The                         procedure was verified in the procedure room.                        - ASA Grade Assessment: III - A patient with severe                         systemic disease.                        - After reviewing the risks and benefits, the patient                         was deemed in satisfactory condition to undergo the                         procedure.                        After obtaining informed consent, the colonoscope was                         passed under direct vision. Throughout the procedure,                         the patient's blood pressure, pulse, and oxygen  saturations were monitored continuously. The                         Colonoscope was introduced through the anus and                         advanced to the the cecum, identified by appendiceal                         orifice and ileocecal valve. The colonoscopy was                         performed  without difficulty. The patient tolerated                         the procedure well. The quality of the bowel                         preparation was excellent. The ileocecal valve,                         appendiceal orifice, and rectum were photographed. Findings:      The perianal and digital rectal examinations were normal. Pertinent       negatives include normal sphincter tone and no palpable rectal lesions.      The entire examined colon appeared normal on direct and retroflexion       views. Impression:            - The entire examined colon is normal on direct and                         retroflexion views.                        - No specimens collected. Recommendation:        - Patient has a contact number available for                         emergencies. The signs and symptoms of potential                         delayed complications were discussed with the patient.                         Return to normal activities tomorrow. Written                         discharge instructions were provided to the patient.                        - Resume previous diet.                        - Continue present medications.                        - Repeat colonoscopy in 10 years for screening                         purposes.                        -  Return to GI office PRN.                        - The findings and recommendations were discussed with                         the patient. Procedure Code(s):     --- Professional ---                        [id number], Colorectal cancer screening; colonoscopy on                         individual not meeting criteria for high risk Diagnosis Code(s):     --- Professional ---                        Z12.11, Encounter for screening for malignant neoplasm                         of colon CPT copyright 2019 [employer]. All rights reserved. The codes documented in this report are preliminary and upon coder review may  be revised to  meet current compliance requirements. Efrain Sella MD, MD 11/30/2018 2:02:45 PM This report has been signed electronically. Number of Addenda: 0 Note Initiated On: 11/30/2018 1:37 PM Scope Withdrawal Time: 0 hours 6 minutes 33 seconds  Total Procedure Duration: 0 hours 11 minutes 5 seconds  Estimated Blood Loss:  Estimated blood loss: none.      [facility]

## 2018-12-01 ENCOUNTER — Encounter: Payer: Self-pay | Admitting: Internal Medicine

## 2019-05-12 ENCOUNTER — Inpatient Hospital Stay: Payer: BC Managed Care – PPO

## 2019-05-12 ENCOUNTER — Emergency Department: Payer: BC Managed Care – PPO

## 2019-05-12 ENCOUNTER — Inpatient Hospital Stay
Admission: EM | Admit: 2019-05-12 | Discharge: 2019-05-16 | DRG: 177 | Disposition: A | Discharge status: Home / Self Care | Payer: BC Managed Care – PPO | Attending: Internal Medicine | Admitting: Internal Medicine

## 2019-05-12 ENCOUNTER — Other Ambulatory Visit: Payer: Self-pay

## 2019-05-12 DIAGNOSIS — Z886 Allergy status to analgesic agent status: Secondary | ICD-10-CM

## 2019-05-12 DIAGNOSIS — J1282 Pneumonia due to coronavirus disease 2019: Secondary | ICD-10-CM

## 2019-05-12 DIAGNOSIS — G4733 Obstructive sleep apnea (adult) (pediatric): Secondary | ICD-10-CM | POA: Diagnosis present

## 2019-05-12 DIAGNOSIS — M069 Rheumatoid arthritis, unspecified: Secondary | ICD-10-CM | POA: Diagnosis present

## 2019-05-12 DIAGNOSIS — U071 COVID-19: Secondary | ICD-10-CM | POA: Diagnosis present

## 2019-05-12 DIAGNOSIS — R079 Chest pain, unspecified: Secondary | ICD-10-CM | POA: Diagnosis not present

## 2019-05-12 DIAGNOSIS — Z9981 Dependence on supplemental oxygen: Secondary | ICD-10-CM

## 2019-05-12 DIAGNOSIS — I10 Essential (primary) hypertension: Secondary | ICD-10-CM | POA: Diagnosis present

## 2019-05-12 DIAGNOSIS — Z86010 Personal history of colonic polyps: Secondary | ICD-10-CM

## 2019-05-12 DIAGNOSIS — R0602 Shortness of breath: Secondary | ICD-10-CM | POA: Diagnosis present

## 2019-05-12 DIAGNOSIS — J9601 Acute respiratory failure with hypoxia: Secondary | ICD-10-CM | POA: Diagnosis present

## 2019-05-12 DIAGNOSIS — Z87442 Personal history of urinary calculi: Secondary | ICD-10-CM

## 2019-05-12 DIAGNOSIS — Z79891 Long term (current) use of opiate analgesic: Secondary | ICD-10-CM

## 2019-05-12 DIAGNOSIS — Z79899 Other long term (current) drug therapy: Secondary | ICD-10-CM | POA: Diagnosis not present

## 2019-05-12 DIAGNOSIS — E785 Hyperlipidemia, unspecified: Secondary | ICD-10-CM | POA: Diagnosis present

## 2019-05-12 LAB — HEPATIC FUNCTION PANEL
ALT: 44 U/L (ref 0–44)
AST: 68 U/L — ABNORMAL HIGH (ref 15–41)
Albumin: 3.5 g/dL (ref 3.5–5.0)
Alkaline Phosphatase: 79 U/L (ref 38–126)
Bilirubin, Direct: 0.2 mg/dL (ref 0.0–0.2)
Indirect Bilirubin: 0.7 mg/dL (ref 0.3–0.9)
Total Bilirubin: 0.9 mg/dL (ref 0.3–1.2)
Total Protein: 7 g/dL (ref 6.5–8.1)

## 2019-05-12 LAB — CBC WITH DIFFERENTIAL/PLATELET
Abs Immature Granulocytes: 0.02 10*3/uL (ref 0.00–0.07)
Basophils Absolute: 0 10*3/uL (ref 0.0–0.1)
Basophils Relative: 0 %
Eosinophils Absolute: 0 10*3/uL (ref 0.0–0.5)
Eosinophils Relative: 0 %
HCT: 43.3 % (ref 39.0–52.0)
Hemoglobin: 14 g/dL (ref 13.0–17.0)
Immature Granulocytes: 0 %
Lymphocytes Relative: 13 %
Lymphs Abs: 0.8 10*3/uL (ref 0.7–4.0)
MCH: 27.9 pg (ref 26.0–34.0)
MCHC: 32.3 g/dL (ref 30.0–36.0)
MCV: 86.3 fL (ref 80.0–100.0)
Monocytes Absolute: 0.4 10*3/uL (ref 0.1–1.0)
Monocytes Relative: 6 %
Neutro Abs: 4.7 10*3/uL (ref 1.7–7.7)
Neutrophils Relative %: 81 %
Platelets: 228 10*3/uL (ref 150–400)
RBC: 5.02 MIL/uL (ref 4.22–5.81)
RDW: 13.2 % (ref 11.5–15.5)
WBC: 5.9 10*3/uL (ref 4.0–10.5)
nRBC: 0 % (ref 0.0–0.2)

## 2019-05-12 LAB — TROPONIN I (HIGH SENSITIVITY)
Troponin I (High Sensitivity): 14 ng/L (ref <18)
Troponin I (High Sensitivity): 13 ng/L (ref <18)

## 2019-05-12 LAB — PROTIME-INR
INR: 1 (ref 0.8–1.2)
Prothrombin Time: 12.7 seconds (ref 11.4–15.2)

## 2019-05-12 LAB — FIBRINOGEN: Fibrinogen: 638 mg/dL — ABNORMAL HIGH (ref 210–475)

## 2019-05-12 LAB — HEPATITIS B SURFACE ANTIGEN: Hepatitis B Surface Ag: NONREACTIVE

## 2019-05-12 LAB — TRIGLYCERIDES: Triglycerides: 142 mg/dL (ref <150)

## 2019-05-12 LAB — BASIC METABOLIC PANEL
Anion gap: 12 (ref 5–15)
BUN: 25 mg/dL — ABNORMAL HIGH (ref 6–20)
CO2: 22 mmol/L (ref 22–32)
Calcium: 8.4 mg/dL — ABNORMAL LOW (ref 8.9–10.3)
Chloride: 105 mmol/L (ref 98–111)
Creatinine, Ser: 0.92 mg/dL (ref 0.61–1.24)
GFR calc Af Amer: >60 mL/min (ref >60)
GFR calc non Af Amer: >60 mL/min (ref >60)
Glucose, Bld: 137 mg/dL — ABNORMAL HIGH (ref 70–99)
Potassium: 4.2 mmol/L (ref 3.5–5.1)
Sodium: 139 mmol/L (ref 135–145)

## 2019-05-12 LAB — PROCALCITONIN: Procalcitonin: 0.12 ng/mL

## 2019-05-12 LAB — RESPIRATORY PANEL BY RT PCR (FLU A&B, COVID)
Influenza A by PCR: NEGATIVE
Influenza B by PCR: NEGATIVE
SARS Coronavirus 2 by RT PCR: POSITIVE — AB

## 2019-05-12 LAB — LACTATE DEHYDROGENASE: LDH: 413 U/L — ABNORMAL HIGH (ref 98–192)

## 2019-05-12 LAB — HIV ANTIBODY (ROUTINE TESTING W REFLEX): HIV Screen 4th Generation wRfx: NONREACTIVE

## 2019-05-12 LAB — BRAIN NATRIURETIC PEPTIDE: B Natriuretic Peptide: 30 pg/mL (ref 0.0–100.0)

## 2019-05-12 LAB — APTT: aPTT: 39 seconds — ABNORMAL HIGH (ref 24–36)

## 2019-05-12 LAB — FERRITIN: Ferritin: 316 ng/mL (ref 24–336)

## 2019-05-12 LAB — FIBRIN DERIVATIVES D-DIMER (ARMC ONLY): Fibrin derivatives D-dimer (ARMC): 916.52 ng/mL (FEU) — ABNORMAL HIGH (ref 0.00–499.00)

## 2019-05-12 MED ORDER — ENOXAPARIN SODIUM 40 MG/0.4ML ~~LOC~~ SOLN
40.0000 mg | SUBCUTANEOUS | Status: DC
  Administered 2019-05-13 – 2019-05-15 (×3): 40 mg via SUBCUTANEOUS
  Filled 2019-05-12 (×3): qty 0.4

## 2019-05-12 MED ORDER — ACETAMINOPHEN 325 MG PO TABS: 650.0000 mg | ORAL_TABLET | Freq: Four times a day (QID) | ORAL | Status: DC | PRN

## 2019-05-12 MED ORDER — ASCORBIC ACID 500 MG PO TABS
500.0000 mg | ORAL_TABLET | Freq: Every day | ORAL | Status: DC
  Administered 2019-05-12 – 2019-05-16 (×5): 500 mg via ORAL
  Filled 2019-05-12 (×5): qty 1

## 2019-05-12 MED ORDER — IOHEXOL 350 MG/ML SOLN
75.0000 mL | Freq: Once | INTRAVENOUS | Status: AC | PRN
  Administered 2019-05-12: 75 mL via INTRAVENOUS

## 2019-05-12 MED ORDER — HEPARIN BOLUS VIA INFUSION
4000.0000 [IU] | Freq: Once | INTRAVENOUS | Status: DC
  Filled 2019-05-12: qty 4000

## 2019-05-12 MED ORDER — SODIUM CHLORIDE 0.9 % IV SOLN
100.0000 mg | Freq: Every day | INTRAVENOUS | Status: DC
  Administered 2019-05-13 – 2019-05-15 (×3): 100 mg via INTRAVENOUS
  Filled 2019-05-12 (×2): qty 100
  Filled 2019-05-12 (×2): qty 20

## 2019-05-12 MED ORDER — DEXAMETHASONE SODIUM PHOSPHATE 10 MG/ML IJ SOLN
10.0000 mg | Freq: Once | INTRAMUSCULAR | Status: AC
  Administered 2019-05-12: 10 mg via INTRAVENOUS
  Filled 2019-05-12: qty 1

## 2019-05-12 MED ORDER — ZINC SULFATE 220 (50 ZN) MG PO CAPS
220.0000 mg | ORAL_CAPSULE | Freq: Every day | ORAL | Status: DC
  Administered 2019-05-12 – 2019-05-16 (×5): 220 mg via ORAL
  Filled 2019-05-12 (×5): qty 1

## 2019-05-12 MED ORDER — SODIUM CHLORIDE 0.9 % IV SOLN
200.0000 mg | Freq: Once | INTRAVENOUS | Status: AC
  Administered 2019-05-12: 200 mg via INTRAVENOUS
  Filled 2019-05-12: qty 40

## 2019-05-12 MED ORDER — VITAMIN B-6 50 MG PO TABS
50.0000 mg | ORAL_TABLET | Freq: Every day | ORAL | Status: DC
  Administered 2019-05-12 – 2019-05-16 (×5): 50 mg via ORAL
  Filled 2019-05-12 (×6): qty 1

## 2019-05-12 MED ORDER — DM-GUAIFENESIN ER 30-600 MG PO TB12
1.0000 | ORAL_TABLET | Freq: Two times a day (BID) | ORAL | Status: DC
  Administered 2019-05-12 – 2019-05-16 (×9): 1 via ORAL
  Filled 2019-05-12 (×9): qty 1

## 2019-05-12 MED ORDER — HYDRALAZINE HCL 20 MG/ML IJ SOLN: 5.0000 mg | INTRAMUSCULAR | Status: DC | PRN

## 2019-05-12 MED ORDER — VITAMIN B-12 1000 MCG PO TABS
1000.0000 ug | ORAL_TABLET | Freq: Every day | ORAL | Status: DC
  Administered 2019-05-12 – 2019-05-16 (×5): 1000 ug via ORAL
  Filled 2019-05-12 (×5): qty 1

## 2019-05-12 MED ORDER — COENZYME Q10 30 MG PO CAPS: 30.0000 mg | ORAL_CAPSULE | Freq: Every day | ORAL | Status: DC

## 2019-05-12 MED ORDER — LOSARTAN POTASSIUM 50 MG PO TABS
50.0000 mg | ORAL_TABLET | Freq: Every day | ORAL | Status: DC
  Administered 2019-05-13 – 2019-05-16 (×4): 50 mg via ORAL
  Filled 2019-05-12 (×5): qty 1

## 2019-05-12 MED ORDER — METHYLPREDNISOLONE SODIUM SUCC 125 MG IJ SOLR
60.0000 mg | Freq: Two times a day (BID) | INTRAMUSCULAR | Status: DC
  Administered 2019-05-13 – 2019-05-16 (×7): 60 mg via INTRAVENOUS
  Filled 2019-05-12 (×7): qty 2

## 2019-05-12 MED ORDER — MORPHINE SULFATE (PF) 2 MG/ML IV SOLN: 2.0000 mg | INTRAVENOUS | Status: DC | PRN

## 2019-05-12 MED ORDER — OXYCODONE-ACETAMINOPHEN 5-325 MG PO TABS
1.0000 | ORAL_TABLET | ORAL | Status: DC | PRN
  Administered 2019-05-13 (×2): 1 via ORAL
  Filled 2019-05-12 (×3): qty 1

## 2019-05-12 MED ORDER — SELENIUM 50 MCG PO TABS: 100.0000 ug | ORAL_TABLET | Freq: Every day | ORAL | Status: DC

## 2019-05-12 MED ORDER — ALBUTEROL SULFATE HFA 108 (90 BASE) MCG/ACT IN AERS
2.0000 | INHALATION_SPRAY | RESPIRATORY_TRACT | Status: DC | PRN
  Administered 2019-05-12: 2 via RESPIRATORY_TRACT
  Filled 2019-05-12: qty 6.7

## 2019-05-12 MED ORDER — HEPARIN (PORCINE) 25000 UT/250ML-% IV SOLN
1450.0000 [IU]/h | INTRAVENOUS | Status: DC
  Filled 2019-05-12: qty 250

## 2019-05-12 MED ORDER — LACTATED RINGERS IV BOLUS
1000.0000 mL | Freq: Once | INTRAVENOUS | Status: AC
  Administered 2019-05-12: 1000 mL via INTRAVENOUS

## 2019-05-12 MED ORDER — IPRATROPIUM BROMIDE HFA 17 MCG/ACT IN AERS
2.0000 | INHALATION_SPRAY | RESPIRATORY_TRACT | Status: DC
  Administered 2019-05-13 – 2019-05-16 (×15): 2 via RESPIRATORY_TRACT
  Filled 2019-05-12 (×3): qty 12.9

## 2019-05-12 MED ORDER — ONDANSETRON HCL 4 MG/2ML IJ SOLN: 4.0000 mg | Freq: Three times a day (TID) | INTRAMUSCULAR | Status: DC | PRN

## 2019-05-12 MED ORDER — ADULT MULTIVITAMIN W/MINERALS CH
1.0000 | ORAL_TABLET | Freq: Every day | ORAL | Status: DC
  Administered 2019-05-12 – 2019-05-16 (×5): 1 via ORAL
  Filled 2019-05-12 (×5): qty 1

## 2019-05-12 NOTE — ED Triage Notes (Signed)
Pt c/o cough with congestion, SOB with positive covid test for the past week, states he is having chest pain with cough.

## 2019-05-12 NOTE — ED Notes (Signed)
Report to Ashley, RN

## 2019-05-12 NOTE — Consult Note (Addendum)
ANTICOAGULATION CONSULT NOTE - Initial Consult  Pharmacy Consult for Heparin Infusion Indication: High suspicion for PE in setting of COVID-19 infection  Allergies  Allergen Reactions  . Aspirin Hives    Patient Measurements: Height: 5\' 11"  (180.3 cm) Weight: 90.7 kg (200 lb) IBW/kg (Calculated) : 75.3 Heparin Dosing Weight: 90.7 kg  Vital Signs: Temp: 99.1 F (37.3 C) (04/23 1630) Temp Source: Oral (04/23 1630) BP: 153/90 (04/23 1630) Pulse Rate: 100 (04/23 1630)  Labs: Recent Labs    05/12/19 1127 05/12/19 1430  HGB 14.0  --   HCT 43.3  --   PLT 228  --   CREATININE 0.92  --   TROPONINIHS 14 13    Estimated Creatinine Clearance: 107 mL/min (by C-G formula based on SCr of 0.92 mg/dL).   Medical History: Past Medical History:  Diagnosis Date  . Biceps tendinitis   . Colon polyp   . Headache    migraine  . Hyperlipidemia   . Renal calculi 04/26/2013  . Sleep apnea    uses CPAP    Medications:  No anticoagulation prior to admission per chart  Assessment: Patient is a 54 y/o M with a past medical history significant for hypertension, OSA who presented for shortness of breath in setting of COVID-19 pneumonia. Patient complaining of severe pleuritic chest pain. CTA without evidence of PE. Patient is hypertensive. Pharmacy has been consulted to initiate heparin infusion due to high suspicion for PE.   Baseline CBC within normal limits. Baseline aPTT mildly prolonged to 39 s, PT/INR within normal limits.   Goal of Therapy:  Heparin level 0.3-0.7 units/ml Monitor platelets by anticoagulation protocol: Yes   Plan:  -Heparin 4000 units IV bolus x 1 followed by continuous infusion at 1450 units/hr -Heparin level 6 hours after initiation of infusion -Daily CBC per protocol  Update: CTA negative for PE , no longer pursuing IV heparin per MD. Orders d/c before administration.   [place] Resident 05/12/2019,4:49 PM

## 2019-05-12 NOTE — ED Provider Notes (Signed)
[facility] Emergency Department Provider Note   ____________________________________________   First MD Initiated Contact with Patient 05/12/19 1418     (approximate)  I have reviewed the triage vital signs and the nursing notes.   HISTORY  Chief Complaint Shortness of Breath    HPI DEMONDRE Henderson is a 54 y.o. male with possible history of hypertension and sleep apnea who presents to the ED complaining of shortness of breath.  Patient reports that he initially tested positive for COVID-19 last week and has been having worsening difficulty breathing ever since then.  He describes a dry cough as well as sharp pain in the center of his chest whenever he goes to cough.  He has been feeling nauseous with poor p.o. intake as well as diarrhea.  He has also been having consistent fevers with a T-max of 102 a couple of days ago.  His wife has had similar symptoms and they both tested positive at their PCPs office.  He has not been taking any medications for this at home.        Past Medical History:  Diagnosis Date  . Biceps tendinitis   . Colon polyp   . Headache    migraine  . Hyperlipidemia   . Renal calculi 04/26/2013  . Sleep apnea    uses CPAP    Patient Active Problem List   Diagnosis Date Noted  . HTN (hypertension) 05/12/2019  . OSA (obstructive sleep apnea) 05/12/2019  . Pneumonia due to COVID-19 virus 05/12/2019  . Acute respiratory failure with hypoxia ([place]) 05/12/2019    Past Surgical History:  Procedure Laterality Date  . COLONOSCOPY WITH PROPOFOL N/A 09/20/2015   Procedure: COLONOSCOPY WITH PROPOFOL;  Surgeon: Manya Silvas, MD;  Location: [facility] ENDOSCOPY;  Service: Endoscopy;  Laterality: N/A;  . COLONOSCOPY WITH PROPOFOL N/A 11/30/2018   Procedure: COLONOSCOPY WITH PROPOFOL;  Surgeon: Toledo, Benay Pike, MD;  Location: [facility] ENDOSCOPY;  Service: Gastroenterology;  Laterality: N/A;  . LITHOTRIPSY      Prior to Admission  medications   Medication Sig Start Date End Date Taking? Authorizing Provider  acetaminophen (TYLENOL) 500 MG tablet Take by mouth. Take 1,500 mg by mouth as needed for pain    [provider]  co-enzyme Q-10 30 MG capsule Take by mouth daily.    [provider]  cyclobenzaprine (FLEXERIL) 10 MG tablet Take 10 mg by mouth 3 (three) times daily as needed for muscle spasms.    [provider]  ketorolac (TORADOL) 10 MG tablet Take 10 mg by mouth every 6 (six) hours as needed.    [provider]  losartan (COZAAR) 50 MG tablet Take 50 mg by mouth daily.    [provider]  Multiple Vitamin (MULTIVITAMIN) tablet Take 1 tablet by mouth daily.    [provider]  naproxen (NAPROSYN) 500 MG tablet Take 500 mg by mouth 2 (two) times daily with a meal.    [provider]  pyridOXINE (B-6) 50 MG tablet Take 50 mg by mouth daily.    [provider]  SELENIUM PO Take by mouth daily.    [provider]  vitamin B-12 (CYANOCOBALAMIN) 1000 MCG tablet Take 1,000 mcg by mouth daily.    [provider]    Allergies Aspirin  No family history on file.  Social History Social History   Tobacco Use  . Smoking status: Never Smoker  . Smokeless tobacco: Never Used  Substance Use Topics  . Alcohol  use: No  . Drug use: No    Review of Systems  Constitutional: No fever/chills Eyes: No visual changes. ENT: No sore throat. Cardiovascular: Positive for chest pain. Respiratory: Positive for cough and shortness of breath. Gastrointestinal: No abdominal pain.  Positive for nausea, no vomiting.  Positive for diarrhea.  No constipation. Genitourinary: Negative for dysuria. Musculoskeletal: Negative for back pain. Skin: Negative for rash. Neurological: Negative for headaches, focal weakness or numbness.  ____________________________________________   PHYSICAL EXAM:  VITAL SIGNS: ED Triage Vitals  Enc Vitals Group       BP 05/12/19 1108 (!) 120/58     Pulse Rate 05/12/19 1108 (!) 108     Resp 05/12/19 1108 20     Temp 05/12/19 1108 99.4 F (37.4 C)     Temp Source 05/12/19 1108 Oral     SpO2 05/12/19 1108 (!) 87 %     Weight 05/12/19 1110 200 lb (90.7 kg)     Height 05/12/19 1110 5\' 11"  (1.803 m)     Head Circumference --      Peak Flow --      Pain Score 05/12/19 1110 9     Pain Loc --      Pain Edu? --      Excl. in [place]? --     Constitutional: Alert and oriented. Eyes: Conjunctivae are normal. Head: Atraumatic. Nose: No congestion/rhinnorhea. Mouth/Throat: Mucous membranes are moist. Neck: Normal ROM Cardiovascular: Tachycardic, regular rhythm. Grossly normal heart sounds. Respiratory: Mild tachypnea with normal respiratory effort.  No retractions. Lungs CTAB. Gastrointestinal: Soft and nontender. No distention. Genitourinary: deferred Musculoskeletal: No lower extremity tenderness nor edema. Neurologic:  Normal speech and language. No gross focal neurologic deficits are appreciated. Skin:  Skin is warm, dry and intact. No rash noted. Psychiatric: Mood and affect are normal. Speech and behavior are normal.  ____________________________________________   LABS (all labs ordered are listed, but only abnormal results are displayed)  Labs Reviewed  BASIC METABOLIC PANEL - Abnormal; Notable for the following components:      Result Value   Glucose, Bld 137 (*)    BUN 25 (*)    Calcium 8.4 (*)    All other components within normal limits  RESPIRATORY PANEL BY RT PCR (FLU A&B, COVID)  CBC WITH DIFFERENTIAL/PLATELET  HEPATIC FUNCTION PANEL  TROPONIN I (HIGH SENSITIVITY)  TROPONIN I (HIGH SENSITIVITY)   ____________________________________________  EKG  ED ECG REPORT I, Blake Divine, the attending physician, personally viewed and interpreted this ECG.   Date: 05/12/2019  EKG Time: 11:17  Rate: 111  Rhythm: normal sinus rhythm, PAC's noted  Axis: Normal  Intervals:none   ST&T Change: None   PROCEDURES  Procedure(s) performed (including Critical Care):  Procedures   ____________________________________________   INITIAL IMPRESSION / ASSESSMENT AND PLAN / ED COURSE       54 year old male with history of hypertension and OSA presents to the ED complaining of increased shortness of breath and chest pain following testing positive for COVID-19 about 1 week ago.  He is mildly tachypneic but not in any respiratory distress, noted to be hypoxic on room air but now maintaining O2 sats on 2 L nasal cannula.  Chest x-ray appears consistent with multifocal pneumonia and COVID-19, EKG shows no evidence of arrhythmia or ischemia.  Lab work is unremarkable, we will treat with steroids and case was discussed with hospitalist for admission.      ____________________________________________   FINAL CLINICAL IMPRESSION(S) / ED DIAGNOSES  Final diagnoses:  Pneumonia due to COVID-19 virus  Chest pain, unspecified type     ED Discharge Orders    None       Note:  This document was prepared using Dragon voice recognition software and may include unintentional dictation errors.   Blake Divine, MD 05/12/19 [phone]

## 2019-05-12 NOTE — Consult Note (Signed)
Remdesivir - Pharmacy Brief Note   O:  ALT: No data this admission. Was within normal limits in 07/2018 per outside records CXR: Impression of "Diffuse bilateral pulmonary infiltrates concerning for multifocal Pneumonia." SpO2: Hypoxic requiring 3L [place]   A/P:  In-patient SARS-CoV-2 test in process. Patient reported recent positive test.   Remdesivir 200 mg IVPB once followed by 100 mg IVPB daily x 4 days.   [place] Resident 05/12/2019 3:04 PM

## 2019-05-12 NOTE — Progress Notes (Signed)
PHARMACIST - PHYSICIAN ORDER COMMUNICATION  CONCERNING: P&T Medication Policy on Herbal Medications  DESCRIPTION:  This patient's order for:  Selenium, Co Q-10  has been noted.  This product(s) is classified as an "herbal" or natural product. Due to a lack of definitive safety studies or FDA approval, nonstandard manufacturing practices, plus the potential risk of unknown drug-drug interactions while on inpatient medications, the Pharmacy and Therapeutics Committee does not permit the use of "herbal" or natural products of this type within [facility].   ACTION TAKEN: The pharmacy department is unable to verify this order at this time and your patient has been informed of this safety policy. Please reevaluate patient's clinical condition at discharge and address if the herbal or natural product(s) should be resumed at that time.

## 2019-05-12 NOTE — ED Notes (Signed)
Date and time results received: 05/12/19 3:58 PM  (use smartphrase ".now" to insert current time)  Test: covid Critical Value: positive  Name of Provider Notified: Dr. Charna Archer

## 2019-05-12 NOTE — H&P (Signed)
History and Physical    Derrick Henderson [id number] DOB: 08-10-1965 DOA: 05/12/2019  Referring MD/NP/PA:   PCP: [place] Pink, MD   Patient coming from:  The patient is coming from home.  At baseline, pt is independent for most of ADL.        Chief Complaint: SOB  HPI: Derrick Henderson is a 54 y.o. male with medical history significant of hypertension, hyperlipidemia, OSA on CPAP, rheumatoid arthritis, who presents with shortness of breath.  Patient states that he had positive COVID-19 test last week in PCPs office.  Since then he has been having cough, shortness of breath which has been progressively worsening.  Patient has a subjective fever and chills.  He reports that he has a frontal chest pain, which is a constant, sharp, 9 out of 10 in severity, pleuritic, aggravated by deep breath and coughing.  Patient does not have nausea vomiting, diarrhea or abdominal pain.  No symptoms of UTI or unilateral weakness.  Patient states that he has history of rheumatoid arthritis in his shoulders.  ED Course: pt was found to have WBC 5.9, troponin 14, positive Covid PCR, electrolytes renal function okay, temperature 99.4, blood pressure 163/94, heart rate 115, oxygen saturation 87% on room air, which improved to 94% on 3 L nasal cannula oxygen.  Chest x-ray showed bilateral multifocal infiltration. Pt is admitted to progressive bed as inpatient.  Review of Systems:   General: has subjective fevers, chills, no body weight gain, has fatigue HEENT: no blurry vision, hearing changes or sore throat Respiratory: has dyspnea, coughing, no wheezing CV: has chest pain, no palpitations GI: no nausea, vomiting, abdominal pain, diarrhea, constipation GU: no dysuria, burning on urination, increased urinary frequency, hematuria  Ext: no leg edema Neuro: no unilateral weakness, numbness, or tingling, no vision change or hearing loss Skin: no rash, no skin tear. MSK: No muscle spasm, no deformity, no  limitation of range of movement in spin Heme: No easy bruising.  Travel history: No recent long distant travel.  Allergy:  Allergies  Allergen Reactions  . Aspirin Hives    Past Medical History:  Diagnosis Date  . Biceps tendinitis   . Colon polyp   . Headache    migraine  . Hyperlipidemia   . Renal calculi 04/26/2013  . Sleep apnea    uses CPAP    Past Surgical History:  Procedure Laterality Date  . COLONOSCOPY WITH PROPOFOL N/A 09/20/2015   Procedure: COLONOSCOPY WITH PROPOFOL;  Surgeon: Manya Silvas, MD;  Location: [facility] ENDOSCOPY;  Service: Endoscopy;  Laterality: N/A;  . COLONOSCOPY WITH PROPOFOL N/A 11/30/2018   Procedure: COLONOSCOPY WITH PROPOFOL;  Surgeon: Toledo, Benay Pike, MD;  Location: [facility] ENDOSCOPY;  Service: Gastroenterology;  Laterality: N/A;  . LITHOTRIPSY      Social History:  reports that he has never smoked. He has never used smokeless tobacco. He reports that he does not drink alcohol or use drugs.  Family History: I have reviewed with patient, patient does not know much family medical history.  He is raised up by his grandmother who passed away.  Prior to Admission medications   Medication Sig Start Date End Date Taking? Authorizing Provider  acetaminophen (TYLENOL) 500 MG tablet Take by mouth. Take 1,500 mg by mouth as needed for pain    [provider]  co-enzyme Q-10 30 MG capsule Take by mouth daily.    [provider]  cyclobenzaprine (FLEXERIL) 10 MG tablet Take 10 mg by mouth 3 (three) times  daily as needed for muscle spasms.    [provider]  ketorolac (TORADOL) 10 MG tablet Take 10 mg by mouth every 6 (six) hours as needed.    [provider]  losartan (COZAAR) 50 MG tablet Take 50 mg by mouth daily.    [provider]  Multiple Vitamin (MULTIVITAMIN) tablet Take 1 tablet by mouth daily.    [provider]  naproxen (NAPROSYN) 500 MG tablet Take 500 mg by mouth 2 (two) times daily with  a meal.    [provider]  pyridOXINE (B-6) 50 MG tablet Take 50 mg by mouth daily.    [provider]  SELENIUM PO Take by mouth daily.    [provider]  vitamin B-12 (CYANOCOBALAMIN) 1000 MCG tablet Take 1,000 mcg by mouth daily.    [provider]    Physical Exam: Vitals:   05/12/19 1430 05/12/19 1500 05/12/19 1630 05/12/19 1730  BP: (!) 148/93 (!) 143/89 (!) 153/90 138/83  Pulse: (!) 106 (!) 104 100 100  Resp: (!) 31 (!) 30 (!) 31 (!) 26  Temp:   99.1 F (37.3 C)   TempSrc:   Oral   SpO2: 93% 96% 90% 90%  Weight:      Height:       General: Not in acute distress HEENT:       Eyes: PERRL, EOMI, no scleral icterus.       ENT: No discharge from the ears and nose, no pharynx injection, no tonsillar enlargement.        Neck: No JVD, no bruit, no mass felt. Heme: No neck lymph node enlargement. Cardiac: S1/S2, RRR, No murmurs, No gallops or rubs. Respiratory: No rales, wheezing, rhonchi or rubs. GI: Soft, nondistended, nontender, no rebound pain, no organomegaly, BS present. GU: No hematuria Ext: No pitting leg edema bilaterally. 2+DP/PT pulse bilaterally. Musculoskeletal: No joint deformities, No joint redness or warmth, no limitation of ROM in spin. Skin: No rashes.  Neuro: Alert, oriented X3, cranial nerves II-XII grossly intact, moves all extremities normally.  Psych: Patient is not psychotic, no suicidal or hemocidal ideation.  Labs on Admission: I have personally reviewed following labs and imaging studies  CBC: Recent Labs  Lab 05/12/19 1127  WBC 5.9  NEUTROABS 4.7  HGB 14.0  HCT 43.3  MCV 86.3  PLT [id number]   Basic Metabolic Panel: Recent Labs  Lab 05/12/19 1127  NA 139  K 4.2  CL 105  CO2 22  GLUCOSE 137*  BUN 25*  CREATININE 0.92  CALCIUM 8.4*   GFR: Estimated Creatinine Clearance: 107 mL/min (by C-G formula based on SCr of 0.92 mg/dL). Liver Function Tests: Recent Labs  Lab 05/12/19 1127  AST 68*  ALT 44    ALKPHOS 79  BILITOT 0.9  PROT 7.0  ALBUMIN 3.5   No results for input(s): LIPASE, AMYLASE in the last 168 hours. No results for input(s): AMMONIA in the last 168 hours. Coagulation Profile: Recent Labs  Lab 05/12/19 1652  INR 1.0   Cardiac Enzymes: No results for input(s): CKTOTAL, CKMB, CKMBINDEX, TROPONINI in the last 168 hours. BNP (last 3 results) No results for input(s): PROBNP in the last 8760 hours. HbA1C: No results for input(s): HGBA1C in the last 72 hours. CBG: No results for input(s): GLUCAP in the last 168 hours. Lipid Profile: No results for input(s): CHOL, HDL, LDLCALC, TRIG, CHOLHDL, LDLDIRECT in the last 72 hours. Thyroid Function Tests: No results for input(s): TSH, T4TOTAL, FREET4, T3FREE, THYROIDAB  in the last 72 hours. Anemia Panel: No results for input(s): VITAMINB12, FOLATE, FERRITIN, TIBC, IRON, RETICCTPCT in the last 72 hours. Urine analysis: No results found for: COLORURINE, APPEARANCEUR, LABSPEC, PHURINE, GLUCOSEU, HGBUR, BILIRUBINUR, KETONESUR, PROTEINUR, UROBILINOGEN, NITRITE, LEUKOCYTESUR Sepsis Labs: @LABRCNTIP (procalcitonin:4,lacticidven:4) ) Recent Results (from the past 240 hour(s))  Respiratory Panel by RT PCR (Flu A&B, Covid) - Nasopharyngeal Swab     Status: Abnormal   Collection Time: 05/12/19  2:36 PM   Specimen: Nasopharyngeal Swab  Result Value Ref Range Status   SARS Coronavirus 2 by RT PCR POSITIVE (A) NEGATIVE Final    Comment: RESULT CALLED TO, READ BACK BY AND VERIFIED WITH: ALLY YOW AT 1549 05/12/19.PMF (NOTE) SARS-CoV-2 target nucleic acids are DETECTED. SARS-CoV-2 RNA is generally detectable in upper respiratory specimens  during the acute phase of infection. Positive results are indicative of the presence of the identified virus, but do not rule out bacterial infection or co-infection with other pathogens not detected by the test. Clinical correlation with patient history and other diagnostic information is necessary to  determine patient infection status. The expected result is Negative. Fact Sheet for Patients:  PinkCheek.be Fact Sheet for Healthcare Providers: GravelBags.it This test is not yet approved or cleared by the [place] FDA and  has been authorized for detection and/or diagnosis of SARS-CoV-2 by FDA under an Emergency Use Authorization (EUA).  This EUA will remain in effect (meaning this test can be used) for the  duration of  the COVID-19 declaration under Section 564(b)(1) of the Act, 21 U.S.C. section 360bbb-3(b)(1), unless the authorization is terminated or revoked sooner.    Influenza A by PCR NEGATIVE NEGATIVE Final   Influenza B by PCR NEGATIVE NEGATIVE Final    Comment: (NOTE) The Xpert Xpress SARS-CoV-2/FLU/RSV assay is intended as an aid in  the diagnosis of influenza from Nasopharyngeal swab specimens and  should not be used as a sole basis for treatment. Nasal washings and  aspirates are unacceptable for Xpert Xpress SARS-CoV-2/FLU/RSV  testing. Fact Sheet for Patients: PinkCheek.be Fact Sheet for Healthcare Providers: GravelBags.it This test is not yet approved or cleared by the [place] FDA and  has been authorized for detection and/or diagnosis of SARS-CoV-2 by  FDA under an Emergency Use Authorization (EUA). This EUA will remain  in effect (meaning this test can be used) for the duration of the  Covid-19 declaration under Section 564(b)(1) of the Act, 21  U.S.C. section 360bbb-3(b)(1), unless the authorization is  terminated or revoked. Performed at [facility], [address] [place], [place] [address]      Radiological Exams on Admission: DG Chest 2 View  Result Date: 05/12/2019 CLINICAL DATA:  Non smoker, +COVId last week, now with increasing SOB EXAM: CHEST - 2 VIEW COMPARISON:  Chest radiograph 08/13/2011 FINDINGS: The heart  size and mediastinal contours are within normal limits for AP technique. Low lung volumes. There are diffuse bilateral pulmonary infiltrates. No pneumothorax or pleural effusion. No acute osseous finding in the visualized skeleton. IMPRESSION: Diffuse bilateral pulmonary infiltrates concerning for multifocal pneumonia. Electronically Signed   By: Audie Pinto M.D.   On: 05/12/2019 12:23   CT ANGIO CHEST PE W OR WO CONTRAST  Result Date: 05/12/2019 CLINICAL DATA:  Recent positive COVID 19 test, chest pain, cough, short of breath EXAM: CT ANGIOGRAPHY CHEST WITH CONTRAST TECHNIQUE: Multidetector CT imaging of the chest was performed using the standard protocol during bolus administration of intravenous contrast. Multiplanar CT image reconstructions and MIPs were obtained  to evaluate the vascular anatomy. CONTRAST:  101mL OMNIPAQUE IOHEXOL 350 MG/ML SOLN COMPARISON:  05/12/2019 FINDINGS: Cardiovascular: This is a technically adequate evaluation of the pulmonary vasculature. No filling defects or pulmonary emboli. The heart is unremarkable without pericardial effusion. Thoracic aorta is normal without aneurysm or dissection. Mediastinum/Nodes: No enlarged mediastinal, hilar, or axillary lymph nodes. Thyroid gland, trachea, and esophagus demonstrate no significant findings. Lungs/Pleura: Multifocal bilateral ground-glass airspace disease consistent with COVID-19 pneumonia. No effusion or pneumothorax. The central airways are patent. Upper Abdomen: There is diffuse hepatic steatosis. Otherwise the upper abdomen is unremarkable. Musculoskeletal: No acute or destructive bony lesions. Reconstructed images demonstrate no additional findings. Review of the MIP images confirms the above findings. IMPRESSION: 1. No CT evidence of pulmonary embolism. 2. Multifocal bilateral ground-glass airspace disease consistent with COVID-19 pneumonia. 3. Diffuse hepatic steatosis. Electronically Signed   By: Randa Ngo M.D.   On:  05/12/2019 17:31     EKG: Independently reviewed.  Sinus rhythm, PAC, QTC 443, tachycardia  Assessment/Plan Principal Problem:   Pneumonia due to COVID-19 virus Active Problems:   HTN (hypertension)   OSA (obstructive sleep apnea)   Acute respiratory failure with hypoxia (HCC)   Chest pain   Acute respiratory failure with hypoxia due to pneumonia due to COVID-19 virus: -will admit to med-surg bed as inpt -Remdesivir per pharm -Solumedrol 60 mg bid -vitamin C, zinc.  -Bronchodilators -PRN Mucinex for cough -f/u Blood culture -Gentle IV fluid:  -D-dimer, BNP,Trop, LFT, CRP, LDH, Procalcitonin, Ferritin, fibinogen, TG, Hep B SAg, HIV ab -Daily CRP, Ferritin, D-dimer, -Will ask the patient to maintain an awake prone position for 16+ hours a day, if possible, with a minimum of 2-3 hours at a time -Will attempt to maintain euvolemia to a net negative fluid status -IF patient deteriorates, will consult PCCM and ID  HTN:  -Continue home medications: Cozaar -hydralazine prn  OSA (obstructive sleep apnea) -hold CPAP due to covid 19 infection  Chest pain: Patient has severe pleuritic chest pain, which is most likely due to Covid pneumonia.  Troponin 14 -->13.  Initially highly suspected PE, started IV heparin before doing CT angiogram.  The result of the CT angiogram is negative for PE.  IV heparin is discontinued. -As needed morphine for chest pain -As needed Percocet   DVT ppx: SQ Lovenox Code Status: Full code Family Communication:  Yes, patient's wife by phone Disposition Plan:  Anticipate discharge back to previous home environment Consults called:  none Admission status:  progressive unit as inpt     Status is: Inpatient Remains inpatient appropriate because: see below Dispo: The patient is from: Home              Anticipated d/c is to: Home              Anticipated d/c date is: 2 days              Patient currently is not medically stable to d/c.     Inpatient  status:  # Patient requires inpatient status due to high intensity of service, high risk for further deterioration and high frequency of surveillance required.  I certify that at the point of admission it is my clinical judgment that the patient will require inpatient hospital care spanning beyond 2 midnights from the point of admission.  . This patient has multiple chronic comorbidities including hypertension, hyperlipidemia, OSA on CPAP . Now patient has presenting with acute respiratory failure with hypoxia due to pneumonia due to  COVID-19 virus . The initial radiographic and laboratory data are worrisome because of multifocal bilateral infiltration on chest x-ray . Current medical needs: please see my assessment and plan . Predictability of an adverse outcome (risk): Patient has multiple comorbidities as listed above. Now presents with acute respiratory failure with hypoxia due to pneumonia due to COVID-19 virus. Patient's presentation is highly complicated.  Patient is at high risk of deteriorating.  Will need to be treated in hospital for at least 2 days.            Date of Service 05/12/2019    [facility]   If 7PM-7AM, please contact night-coverage [email or web address] 05/12/2019, 5:50 PM

## 2019-05-13 LAB — CBC WITH DIFFERENTIAL/PLATELET
Abs Immature Granulocytes: 0.02 10*3/uL (ref 0.00–0.07)
Basophils Absolute: 0 10*3/uL (ref 0.0–0.1)
Basophils Relative: 0 %
Eosinophils Absolute: 0 10*3/uL (ref 0.0–0.5)
Eosinophils Relative: 0 %
HCT: 38.4 % — ABNORMAL LOW (ref 39.0–52.0)
Hemoglobin: 12.8 g/dL — ABNORMAL LOW (ref 13.0–17.0)
Immature Granulocytes: 0 %
Lymphocytes Relative: 11 %
Lymphs Abs: 0.6 10*3/uL — ABNORMAL LOW (ref 0.7–4.0)
MCH: 28.4 pg (ref 26.0–34.0)
MCHC: 33.3 g/dL (ref 30.0–36.0)
MCV: 85.3 fL (ref 80.0–100.0)
Monocytes Absolute: 0.4 10*3/uL (ref 0.1–1.0)
Monocytes Relative: 6 %
Neutro Abs: 4.6 10*3/uL (ref 1.7–7.7)
Neutrophils Relative %: 83 %
Platelets: 241 10*3/uL (ref 150–400)
RBC: 4.5 MIL/uL (ref 4.22–5.81)
RDW: 13.3 % (ref 11.5–15.5)
WBC: 5.6 10*3/uL (ref 4.0–10.5)
nRBC: 0 % (ref 0.0–0.2)

## 2019-05-13 LAB — COMPREHENSIVE METABOLIC PANEL
ALT: 42 U/L (ref 0–44)
AST: 66 U/L — ABNORMAL HIGH (ref 15–41)
Albumin: 3.2 g/dL — ABNORMAL LOW (ref 3.5–5.0)
Alkaline Phosphatase: 74 U/L (ref 38–126)
Anion gap: 9 (ref 5–15)
BUN: 27 mg/dL — ABNORMAL HIGH (ref 6–20)
CO2: 23 mmol/L (ref 22–32)
Calcium: 8.5 mg/dL — ABNORMAL LOW (ref 8.9–10.3)
Chloride: 105 mmol/L (ref 98–111)
Creatinine, Ser: 0.81 mg/dL (ref 0.61–1.24)
GFR calc Af Amer: >60 mL/min (ref >60)
GFR calc non Af Amer: >60 mL/min (ref >60)
Glucose, Bld: 143 mg/dL — ABNORMAL HIGH (ref 70–99)
Potassium: 4.4 mmol/L (ref 3.5–5.1)
Sodium: 137 mmol/L (ref 135–145)
Total Bilirubin: 0.8 mg/dL (ref 0.3–1.2)
Total Protein: 6.7 g/dL (ref 6.5–8.1)

## 2019-05-13 LAB — FERRITIN: Ferritin: 292 ng/mL (ref 24–336)

## 2019-05-13 LAB — C-REACTIVE PROTEIN
CRP: 9.2 mg/dL — ABNORMAL HIGH (ref <1.0)
CRP: 8.3 mg/dL — ABNORMAL HIGH (ref <1.0)

## 2019-05-13 LAB — FIBRIN DERIVATIVES D-DIMER (ARMC ONLY): Fibrin derivatives D-dimer (ARMC): 930.85 ng/mL (FEU) — ABNORMAL HIGH (ref 0.00–499.00)

## 2019-05-13 MED ORDER — MELATONIN 5 MG PO TABS
5.0000 mg | ORAL_TABLET | Freq: Every day | ORAL | Status: DC
  Administered 2019-05-13 – 2019-05-15 (×4): 5 mg via ORAL
  Filled 2019-05-13 (×5): qty 1

## 2019-05-13 MED ORDER — TOCILIZUMAB 400 MG/20ML IV SOLN
800.0000 mg | Freq: Once | INTRAVENOUS | Status: AC
  Administered 2019-05-13: 800 mg via INTRAVENOUS
  Filled 2019-05-13: qty 40

## 2019-05-13 NOTE — Progress Notes (Signed)
PROGRESS NOTE    RAFIK GILLEY  [id number] DOB: 10-08-1965 DOA: 05/12/2019 PCP: [place] Pink, MD   Brief Narrative:  HPI: Derrick Henderson is a 54 y.o. male with medical history significant of hypertension, hyperlipidemia, OSA on CPAP, rheumatoid arthritis, who presents with shortness of breath.  Patient states that he had positive COVID-19 test last week in PCPs office.  Since then he has been having cough, shortness of breath which has been progressively worsening.  Patient has a subjective fever and chills.  He reports that he has a frontal chest pain, which is a constant, sharp, 9 out of 10 in severity, pleuritic, aggravated by deep breath and coughing.  Patient does not have nausea vomiting, diarrhea or abdominal pain.  No symptoms of UTI or unilateral weakness.  Patient states that he has history of rheumatoid arthritis in his shoulders.  4/24: Patient seems and examined.  No acute status changes.  Remains on 8 L high flow nasal cannula.  States pleuritic chest pain is slightly improving.   Assessment & Plan:   Principal Problem:   Pneumonia due to COVID-19 virus Active Problems:   HTN (hypertension)   OSA (obstructive sleep apnea)   Acute respiratory failure with hypoxia (HCC)   Chest pain  Acute respiratory failure with hypoxia due to pneumonia due to COVID-19 virus: Patient symptoms started approximately week prior to presentation Positive test 2 weeks prior to presentation Elevated CRP on admission Cultures no growth to date Plan: Continue remdesivir, day 2 of 5 Continue steroids, day 2 of 10 Vitamin supplementation Trend inflammatory markers Prone as tolerated Actemra 800 mg x 1 dose Titrate down oxygen as tolerated  HTN:  -Continue home medications: Cozaar -hydralazine prn  OSA (obstructive sleep apnea) Home CPAP  Chest pain:  Patient has severe pleuritic chest pain, which is most likely due to Covid pneumonia.  PE ruled out -As needed  morphine for chest pain -As needed Percocet   DVT prophylaxis: Lovenox Code Status: Full code Family Communication:  Disposition Plan: Status is: Inpatient  Remains inpatient appropriate because:Inpatient level of care appropriate due to severity of illness   Dispo: The patient is from: Home              Anticipated d/c is to: Home              Anticipated d/c date is: 3 days              Patient currently is not medically stable to d/c.         Consultants:   None   Procedures:   none  Antimicrobials:   Remdesivir   Subjective: Seen and examined Remain hypoxic Mild improvement in pleuritic chest pain  Objective: Vitals:   05/13/19 0100 05/13/19 0300 05/13/19 0400 05/13/19 0500  BP:   (!) 145/80   Pulse: 86 81 90 84  Resp: (!) 23 (!) 25 (!) 25 (!) 22  Temp:      TempSrc:      SpO2: 97% 95%  95%  Weight:      Height:        Intake/Output Summary (Last 24 hours) at 05/13/2019 1338 Last data filed at 05/12/2019 1831 Gross per 24 hour  Intake --  Output 350 ml  Net -350 ml   Filed Weights   05/12/19 1110  Weight: 90.7 kg    Examination:  General exam: Appears calm and comfortable  Respiratory system: Diffuse bilateral crackles, normal work of breathing Cardiovascular system:  S1 & S2 heard, RRR. No JVD, murmurs, rubs, gallops or clicks. No pedal edema. Gastrointestinal system: Abdomen is nondistended, soft and nontender. No organomegaly or masses felt. Normal bowel sounds heard. Central nervous system: Alert and oriented. No focal neurological deficits. Extremities: Symmetric 5 x 5 power. Skin: No rashes, lesions or ulcers Psychiatry: Judgement and insight appear normal. Mood & affect appropriate.     Data Reviewed: I have personally reviewed following labs and imaging studies  CBC: Recent Labs  Lab 05/12/19 1127 05/13/19 0622  WBC 5.9 5.6  NEUTROABS 4.7 4.6  HGB 14.0 12.8*  HCT 43.3 38.4*  MCV 86.3 85.3  PLT 228 [id number]   Basic  Metabolic Panel: Recent Labs  Lab 05/12/19 1127 05/13/19 0622  NA 139 137  K 4.2 4.4  CL 105 105  CO2 22 23  GLUCOSE 137* 143*  BUN 25* 27*  CREATININE 0.92 0.81  CALCIUM 8.4* 8.5*   GFR: Estimated Creatinine Clearance: 121.6 mL/min (by C-G formula based on SCr of 0.81 mg/dL). Liver Function Tests: Recent Labs  Lab 05/12/19 1127 05/13/19 0622  AST 68* 66*  ALT 44 42  ALKPHOS 79 74  BILITOT 0.9 0.8  PROT 7.0 6.7  ALBUMIN 3.5 3.2*   No results for input(s): LIPASE, AMYLASE in the last 168 hours. No results for input(s): AMMONIA in the last 168 hours. Coagulation Profile: Recent Labs  Lab 05/12/19 1652  INR 1.0   Cardiac Enzymes: No results for input(s): CKTOTAL, CKMB, CKMBINDEX, TROPONINI in the last 168 hours. BNP (last 3 results) No results for input(s): PROBNP in the last 8760 hours. HbA1C: No results for input(s): HGBA1C in the last 72 hours. CBG: No results for input(s): GLUCAP in the last 168 hours. Lipid Profile: Recent Labs    05/12/19 1736  TRIG 142   Thyroid Function Tests: No results for input(s): TSH, T4TOTAL, FREET4, T3FREE, THYROIDAB in the last 72 hours. Anemia Panel: Recent Labs    05/12/19 1736 05/13/19 0622  FERRITIN 316 292   Sepsis Labs: Recent Labs  Lab 05/12/19 1736  PROCALCITON 0.12    Recent Results (from the past 240 hour(s))  Respiratory Panel by RT PCR (Flu A&B, Covid) - Nasopharyngeal Swab     Status: Abnormal   Collection Time: 05/12/19  2:36 PM   Specimen: Nasopharyngeal Swab  Result Value Ref Range Status   SARS Coronavirus 2 by RT PCR POSITIVE (A) NEGATIVE Final    Comment: RESULT CALLED TO, READ BACK BY AND VERIFIED WITH: ALLY YOW AT 1549 05/12/19.PMF (NOTE) SARS-CoV-2 target nucleic acids are DETECTED. SARS-CoV-2 RNA is generally detectable in upper respiratory specimens  during the acute phase of infection. Positive results are indicative of the presence of the identified virus, but do not rule  out bacterial infection or co-infection with other pathogens not detected by the test. Clinical correlation with patient history and other diagnostic information is necessary to determine patient infection status. The expected result is Negative. Fact Sheet for Patients:  PinkCheek.be Fact Sheet for Healthcare Providers: GravelBags.it This test is not yet approved or cleared by the [place] FDA and  has been authorized for detection and/or diagnosis of SARS-CoV-2 by FDA under an Emergency Use Authorization (EUA).  This EUA will remain in effect (meaning this test can be used) for the  duration of  the COVID-19 declaration under Section 564(b)(1) of the Act, 21 U.S.C. section 360bbb-3(b)(1), unless the authorization is terminated or revoked sooner.    Influenza A by PCR NEGATIVE NEGATIVE  Final   Influenza B by PCR NEGATIVE NEGATIVE Final    Comment: (NOTE) The Xpert Xpress SARS-CoV-2/FLU/RSV assay is intended as an aid in  the diagnosis of influenza from Nasopharyngeal swab specimens and  should not be used as a sole basis for treatment. Nasal washings and  aspirates are unacceptable for Xpert Xpress SARS-CoV-2/FLU/RSV  testing. Fact Sheet for Patients: PinkCheek.be Fact Sheet for Healthcare Providers: GravelBags.it This test is not yet approved or cleared by the [place] FDA and  has been authorized for detection and/or diagnosis of SARS-CoV-2 by  FDA under an Emergency Use Authorization (EUA). This EUA will remain  in effect (meaning this test can be used) for the duration of the  Covid-19 declaration under Section 564(b)(1) of the Act, 21  U.S.C. section 360bbb-3(b)(1), unless the authorization is  terminated or revoked. Performed at [facility], [address] [place], [place] [address]   Culture, blood (Routine X 2) w Reflex to ID Panel      Status: None (Preliminary result)   Collection Time: 05/12/19  5:35 PM   Specimen: BLOOD  Result Value Ref Range Status   Specimen Description BLOOD BLOOD RIGHT HAND  Final   Special Requests   Final    BOTTLES DRAWN AEROBIC AND ANAEROBIC Blood Culture adequate volume   Culture   Final    NO GROWTH < 12 HOURS Performed at [facility], [address], [place], [place] [address]    Report Status PENDING  Incomplete  Culture, blood (Routine X 2) w Reflex to ID Panel     Status: None (Preliminary result)   Collection Time: 05/12/19  5:36 PM   Specimen: BLOOD  Result Value Ref Range Status   Specimen Description BLOOD RIGHT ANTECUBITAL  Final   Special Requests   Final    BOTTLES DRAWN AEROBIC AND ANAEROBIC Blood Culture adequate volume   Culture   Final    NO GROWTH < 12 HOURS Performed at [facility], [address] [place],  [address]    Report Status PENDING  Incomplete         Radiology Studies: DG Chest 2 View  Result Date: 05/12/2019 CLINICAL DATA:  Non smoker, +COVId last week, now with increasing SOB EXAM: CHEST - 2 VIEW COMPARISON:  Chest radiograph 08/13/2011 FINDINGS: The heart size and mediastinal contours are within normal limits for AP technique. Low lung volumes. There are diffuse bilateral pulmonary infiltrates. No pneumothorax or pleural effusion. No acute osseous finding in the visualized skeleton. IMPRESSION: Diffuse bilateral pulmonary infiltrates concerning for multifocal pneumonia. Electronically Signed   By: Audie Pinto M.D.   On: 05/12/2019 12:23   CT ANGIO CHEST PE W OR WO CONTRAST  Result Date: 05/12/2019 CLINICAL DATA:  Recent positive COVID 19 test, chest pain, cough, short of breath EXAM: CT ANGIOGRAPHY CHEST WITH CONTRAST TECHNIQUE: Multidetector CT imaging of the chest was performed using the standard protocol during bolus administration of intravenous contrast. Multiplanar CT image reconstructions and MIPs were  obtained to evaluate the vascular anatomy. CONTRAST:  31mL OMNIPAQUE IOHEXOL 350 MG/ML SOLN COMPARISON:  05/12/2019 FINDINGS: Cardiovascular: This is a technically adequate evaluation of the pulmonary vasculature. No filling defects or pulmonary emboli. The heart is unremarkable without pericardial effusion. Thoracic aorta is normal without aneurysm or dissection. Mediastinum/Nodes: No enlarged mediastinal, hilar, or axillary lymph nodes. Thyroid gland, trachea, and esophagus demonstrate no significant findings. Lungs/Pleura: Multifocal bilateral ground-glass airspace disease consistent with COVID-19 pneumonia. No effusion or pneumothorax. The  central airways are patent. Upper Abdomen: There is diffuse hepatic steatosis. Otherwise the upper abdomen is unremarkable. Musculoskeletal: No acute or destructive bony lesions. Reconstructed images demonstrate no additional findings. Review of the MIP images confirms the above findings. IMPRESSION: 1. No CT evidence of pulmonary embolism. 2. Multifocal bilateral ground-glass airspace disease consistent with COVID-19 pneumonia. 3. Diffuse hepatic steatosis. Electronically Signed   By: Randa Ngo M.D.   On: 05/12/2019 17:31        Scheduled Meds: . vitamin C  500 mg Oral Daily  . dextromethorphan-guaiFENesin  1 tablet Oral BID  . enoxaparin (LOVENOX) injection  40 mg Subcutaneous Q24H  . ipratropium  2 puff Inhalation Q4H  . losartan  50 mg Oral Daily  . melatonin  5 mg Oral QHS  . methylPREDNISolone (SOLU-MEDROL) injection  60 mg Intravenous Q12H  . multivitamin with minerals  1 tablet Oral Daily  . pyridOXINE  50 mg Oral Daily  . vitamin B-12  1,000 mcg Oral Daily  . zinc sulfate  220 mg Oral Daily   Continuous Infusions: . remdesivir 100 mg in NS 100 mL 100 mg (05/13/19 1210)  . tocilizumab (ACTEMRA) - non-COVID treatment       LOS: 1 day    Time spent: 35 min    Sidney Ace, MD [facility] Pager [phone]  If  7PM-7AM, please contact night-coverage 05/13/2019, 1:38 PM

## 2019-05-14 LAB — CBC WITH DIFFERENTIAL/PLATELET
Abs Immature Granulocytes: 0.04 10*3/uL (ref 0.00–0.07)
Basophils Absolute: 0 10*3/uL (ref 0.0–0.1)
Basophils Relative: 0 %
Eosinophils Absolute: 0 10*3/uL (ref 0.0–0.5)
Eosinophils Relative: 0 %
HCT: 37.5 % — ABNORMAL LOW (ref 39.0–52.0)
Hemoglobin: 12.3 g/dL — ABNORMAL LOW (ref 13.0–17.0)
Immature Granulocytes: 1 %
Lymphocytes Relative: 12 %
Lymphs Abs: 0.7 10*3/uL (ref 0.7–4.0)
MCH: 28.3 pg (ref 26.0–34.0)
MCHC: 32.8 g/dL (ref 30.0–36.0)
MCV: 86.4 fL (ref 80.0–100.0)
Monocytes Absolute: 0.4 10*3/uL (ref 0.1–1.0)
Monocytes Relative: 8 %
Neutro Abs: 4.4 10*3/uL (ref 1.7–7.7)
Neutrophils Relative %: 79 %
Platelets: 266 10*3/uL (ref 150–400)
RBC: 4.34 MIL/uL (ref 4.22–5.81)
RDW: 13.2 % (ref 11.5–15.5)
WBC: 5.5 10*3/uL (ref 4.0–10.5)
nRBC: 0 % (ref 0.0–0.2)

## 2019-05-14 LAB — COMPREHENSIVE METABOLIC PANEL
ALT: 59 U/L — ABNORMAL HIGH (ref 0–44)
AST: 88 U/L — ABNORMAL HIGH (ref 15–41)
Albumin: 3.1 g/dL — ABNORMAL LOW (ref 3.5–5.0)
Alkaline Phosphatase: 68 U/L (ref 38–126)
Anion gap: 8 (ref 5–15)
BUN: 30 mg/dL — ABNORMAL HIGH (ref 6–20)
CO2: 25 mmol/L (ref 22–32)
Calcium: 8.6 mg/dL — ABNORMAL LOW (ref 8.9–10.3)
Chloride: 106 mmol/L (ref 98–111)
Creatinine, Ser: 0.78 mg/dL (ref 0.61–1.24)
GFR calc Af Amer: >60 mL/min (ref >60)
GFR calc non Af Amer: >60 mL/min (ref >60)
Glucose, Bld: 156 mg/dL — ABNORMAL HIGH (ref 70–99)
Potassium: 4.4 mmol/L (ref 3.5–5.1)
Sodium: 139 mmol/L (ref 135–145)
Total Bilirubin: 0.6 mg/dL (ref 0.3–1.2)
Total Protein: 6.5 g/dL (ref 6.5–8.1)

## 2019-05-14 LAB — C-REACTIVE PROTEIN: CRP: 2.6 mg/dL — ABNORMAL HIGH (ref <1.0)

## 2019-05-14 LAB — FERRITIN: Ferritin: 294 ng/mL (ref 24–336)

## 2019-05-14 LAB — FIBRIN DERIVATIVES D-DIMER (ARMC ONLY): Fibrin derivatives D-dimer (ARMC): 877.68 ng/mL (FEU) — ABNORMAL HIGH (ref 0.00–499.00)

## 2019-05-14 MED ORDER — FUROSEMIDE 10 MG/ML IJ SOLN
40.0000 mg | Freq: Once | INTRAMUSCULAR | Status: AC
  Administered 2019-05-14: 10:00:00 40 mg via INTRAVENOUS
  Filled 2019-05-14: qty 4

## 2019-05-14 NOTE — Plan of Care (Signed)
No acute events over night. Patient has been alert and oriented throughout the night. He complained of pain only when coughing; however, he denied need for pain medication or other interventions. He has been compliant with medication therapies. Remains on oxygen via high flow nasal cannula. Compliant with use of C-Pap while asleep. Appreciate RT assistance with respiratory interventions. He remains free of falls and injuries. Reporting adequate urine output. Plan of care continued.    Problem: Education: Goal: Knowledge of risk factors and measures for prevention of condition will improve Outcome: Progressing   Problem: Coping: Goal: Psychosocial and spiritual needs will be supported Outcome: Progressing   Problem: Respiratory: Goal: Will maintain a patent airway Outcome: Progressing Goal: Complications related to the disease process, condition or treatment will be avoided or minimized Outcome: Progressing

## 2019-05-14 NOTE — Progress Notes (Signed)
Wife given nightly update 

## 2019-05-14 NOTE — Progress Notes (Signed)
PROGRESS NOTE    Derrick Henderson  [id number] DOB: 1966-01-02 DOA: 05/12/2019 PCP: [place] Pink, MD   Brief Narrative:  HPI: Derrick Henderson is a 53 y.o. male with medical history significant of hypertension, hyperlipidemia, OSA on CPAP, rheumatoid arthritis, who presents with shortness of breath.  Patient states that he had positive COVID-19 test last week in PCPs office.  Since then he has been having cough, shortness of breath which has been progressively worsening.  Patient has a subjective fever and chills.  He reports that he has a frontal chest pain, which is a constant, sharp, 9 out of 10 in severity, pleuritic, aggravated by deep breath and coughing.  Patient does not have nausea vomiting, diarrhea or abdominal pain.  No symptoms of UTI or unilateral weakness.  Patient states that he has history of rheumatoid arthritis in his shoulders.  4/24: Patient seems and examined.  No acute status changes.  Remains on 8 L high flow nasal cannula.  States pleuritic chest pain is slightly improving.  4/25: Patient seen and examined.  Oxygen status improving.  Titrated down to 6 L high flow nasal cannula.  No complaint of pleuritic chest pain this morning.  No fevers noted over interval.  Assessment & Plan:   Principal Problem:   Pneumonia due to COVID-19 virus Active Problems:   HTN (hypertension)   OSA (obstructive sleep apnea)   Acute respiratory failure with hypoxia (HCC)   Chest pain  Acute respiratory failure with hypoxia due to pneumonia due to COVID-19 virus: Patient symptoms started approximately week prior to presentation Positive test 2 weeks prior to presentation Elevated CRP on admission Cultures no growth to date Status post Actemra infusion Currently requiring 6 L high flow nasal cannula Plan: Continue remdesivir, day 3 of 5 Continue steroids, day 3 of 10 Vitamin supplementation Trend inflammatory markers Prone as tolerated Titrate down oxygen as  tolerated  HTN:  -Continue home medications: Cozaar -hydralazine prn  OSA (obstructive sleep apnea) Home CPAP  Chest pain:  Patient has severe pleuritic chest pain, which is most likely due to Covid pneumonia.  PE ruled out -As needed morphine for chest pain -As needed Percocet Improved   DVT prophylaxis: Lovenox Code Status: Full code Family Communication: Wife via phone [phone] on 05/14/2019 Disposition Plan: Status is: Inpatient  Remains inpatient appropriate because:Inpatient level of care appropriate due to severity of illness   Dispo: The patient is from: Home              Anticipated d/c is to: Home              Anticipated d/c date is: 3 days              Patient currently is not medically stable to d/c.   Consultants:   None   Procedures:   none  Antimicrobials:   Remdesivir   Subjective: Seen and examined Pleuritic chest pain improved Oxygen saturations improving  Objective: Vitals:   05/14/19 0759 05/14/19 0800 05/14/19 0900 05/14/19 1000  BP: 114/64     Pulse: 79     Resp: 20     Temp: 98.7 F (37.1 C)     TempSrc: Oral     SpO2: 94% 90% 100% 99%  Weight:      Height:        Intake/Output Summary (Last 24 hours) at 05/14/2019 1139 Last data filed at 05/14/2019 1100 Gross per 24 hour  Intake 300 ml  Output 750 ml  Net -450 ml   Filed Weights   05/12/19 1110  Weight: 90.7 kg    Examination:  General exam: Appears calm and comfortable  Respiratory system: Diffuse bilateral crackles, normal work of breathing Cardiovascular system: S1 & S2 heard, RRR. No JVD, murmurs, rubs, gallops or clicks. No pedal edema. Gastrointestinal system: Abdomen is nondistended, soft and nontender. No organomegaly or masses felt. Normal bowel sounds heard. Central nervous system: Alert and oriented. No focal neurological deficits. Extremities: Symmetric 5 x 5 power. Skin: No rashes, lesions or ulcers Psychiatry: Judgement and insight appear  normal. Mood & affect appropriate.     Data Reviewed: I have personally reviewed following labs and imaging studies  CBC: Recent Labs  Lab 05/12/19 1127 05/13/19 0622 05/14/19 0638  WBC 5.9 5.6 5.5  NEUTROABS 4.7 4.6 4.4  HGB 14.0 12.8* 12.3*  HCT 43.3 38.4* 37.5*  MCV 86.3 85.3 86.4  PLT 228 241 [id number]   Basic Metabolic Panel: Recent Labs  Lab 05/12/19 1127 05/13/19 0622 05/14/19 0638  NA 139 137 139  K 4.2 4.4 4.4  CL 105 105 106  CO2 22 23 25   GLUCOSE 137* 143* 156*  BUN 25* 27* 30*  CREATININE 0.92 0.81 0.78  CALCIUM 8.4* 8.5* 8.6*   GFR: Estimated Creatinine Clearance: 123.1 mL/min (by C-G formula based on SCr of 0.78 mg/dL). Liver Function Tests: Recent Labs  Lab 05/12/19 1127 05/13/19 0622 05/14/19 0638  AST 68* 66* 88*  ALT 44 42 59*  ALKPHOS 79 74 68  BILITOT 0.9 0.8 0.6  PROT 7.0 6.7 6.5  ALBUMIN 3.5 3.2* 3.1*   No results for input(s): LIPASE, AMYLASE in the last 168 hours. No results for input(s): AMMONIA in the last 168 hours. Coagulation Profile: Recent Labs  Lab 05/12/19 1652  INR 1.0   Cardiac Enzymes: No results for input(s): CKTOTAL, CKMB, CKMBINDEX, TROPONINI in the last 168 hours. BNP (last 3 results) No results for input(s): PROBNP in the last 8760 hours. HbA1C: No results for input(s): HGBA1C in the last 72 hours. CBG: No results for input(s): GLUCAP in the last 168 hours. Lipid Profile: Recent Labs    05/12/19 1736  TRIG 142   Thyroid Function Tests: No results for input(s): TSH, T4TOTAL, FREET4, T3FREE, THYROIDAB in the last 72 hours. Anemia Panel: Recent Labs    05/13/19 0622 05/14/19 0638  FERRITIN 292 294   Sepsis Labs: Recent Labs  Lab 05/12/19 1736  PROCALCITON 0.12    Recent Results (from the past 240 hour(s))  Respiratory Panel by RT PCR (Flu A&B, Covid) - Nasopharyngeal Swab     Status: Abnormal   Collection Time: 05/12/19  2:36 PM   Specimen: Nasopharyngeal Swab  Result Value Ref Range Status    SARS Coronavirus 2 by RT PCR POSITIVE (A) NEGATIVE Final    Comment: RESULT CALLED TO, READ BACK BY AND VERIFIED WITH: ALLY YOW AT 1549 05/12/19.PMF (NOTE) SARS-CoV-2 target nucleic acids are DETECTED. SARS-CoV-2 RNA is generally detectable in upper respiratory specimens  during the acute phase of infection. Positive results are indicative of the presence of the identified virus, but do not rule out bacterial infection or co-infection with other pathogens not detected by the test. Clinical correlation with patient history and other diagnostic information is necessary to determine patient infection status. The expected result is Negative. Fact Sheet for Patients:  PinkCheek.be Fact Sheet for Healthcare Providers: GravelBags.it This test is not yet approved or cleared by the [place] FDA and  has been  authorized for detection and/or diagnosis of SARS-CoV-2 by FDA under an Emergency Use Authorization (EUA).  This EUA will remain in effect (meaning this test can be used) for the  duration of  the COVID-19 declaration under Section 564(b)(1) of the Act, 21 U.S.C. section 360bbb-3(b)(1), unless the authorization is terminated or revoked sooner.    Influenza A by PCR NEGATIVE NEGATIVE Final   Influenza B by PCR NEGATIVE NEGATIVE Final    Comment: (NOTE) The Xpert Xpress SARS-CoV-2/FLU/RSV assay is intended as an aid in  the diagnosis of influenza from Nasopharyngeal swab specimens and  should not be used as a sole basis for treatment. Nasal washings and  aspirates are unacceptable for Xpert Xpress SARS-CoV-2/FLU/RSV  testing. Fact Sheet for Patients: PinkCheek.be Fact Sheet for Healthcare Providers: GravelBags.it This test is not yet approved or cleared by the [place] FDA and  has been authorized for detection and/or diagnosis of SARS-CoV-2 by  FDA under an  Emergency Use Authorization (EUA). This EUA will remain  in effect (meaning this test can be used) for the duration of the  Covid-19 declaration under Section 564(b)(1) of the Act, 21  U.S.C. section 360bbb-3(b)(1), unless the authorization is  terminated or revoked. Performed at [facility], [address] [place], [place] [address]   Culture, blood (Routine X 2) w Reflex to ID Panel     Status: None (Preliminary result)   Collection Time: 05/12/19  5:35 PM   Specimen: BLOOD  Result Value Ref Range Status   Specimen Description BLOOD BLOOD RIGHT HAND  Final   Special Requests   Final    BOTTLES DRAWN AEROBIC AND ANAEROBIC Blood Culture adequate volume   Culture   Final    NO GROWTH 2 DAYS Performed at [facility], [address] [place], [place] [address]    Report Status PENDING  Incomplete  Culture, blood (Routine X 2) w Reflex to ID Panel     Status: None (Preliminary result)   Collection Time: 05/12/19  5:36 PM   Specimen: BLOOD  Result Value Ref Range Status   Specimen Description BLOOD RIGHT ANTECUBITAL  Final   Special Requests   Final    BOTTLES DRAWN AEROBIC AND ANAEROBIC Blood Culture adequate volume   Culture   Final    NO GROWTH 2 DAYS Performed at [facility], [address] [place], [place] [address]    Report Status PENDING  Incomplete         Radiology Studies: DG Chest 2 View  Result Date: 05/12/2019 CLINICAL DATA:  Non smoker, +COVId last week, now with increasing SOB EXAM: CHEST - 2 VIEW COMPARISON:  Chest radiograph 08/13/2011 FINDINGS: The heart size and mediastinal contours are within normal limits for AP technique. Low lung volumes. There are diffuse bilateral pulmonary infiltrates. No pneumothorax or pleural effusion. No acute osseous finding in the visualized skeleton. IMPRESSION: Diffuse bilateral pulmonary infiltrates concerning for multifocal pneumonia. Electronically Signed   By: Audie Pinto M.D.   On:  05/12/2019 12:23   CT ANGIO CHEST PE W OR WO CONTRAST  Result Date: 05/12/2019 CLINICAL DATA:  Recent positive COVID 19 test, chest pain, cough, short of breath EXAM: CT ANGIOGRAPHY CHEST WITH CONTRAST TECHNIQUE: Multidetector CT imaging of the chest was performed using the standard protocol during bolus administration of intravenous contrast. Multiplanar CT image reconstructions and MIPs were obtained to evaluate the vascular anatomy. CONTRAST:  28mL OMNIPAQUE IOHEXOL 350 MG/ML SOLN COMPARISON:  05/12/2019 FINDINGS: Cardiovascular: This is  a technically adequate evaluation of the pulmonary vasculature. No filling defects or pulmonary emboli. The heart is unremarkable without pericardial effusion. Thoracic aorta is normal without aneurysm or dissection. Mediastinum/Nodes: No enlarged mediastinal, hilar, or axillary lymph nodes. Thyroid gland, trachea, and esophagus demonstrate no significant findings. Lungs/Pleura: Multifocal bilateral ground-glass airspace disease consistent with COVID-19 pneumonia. No effusion or pneumothorax. The central airways are patent. Upper Abdomen: There is diffuse hepatic steatosis. Otherwise the upper abdomen is unremarkable. Musculoskeletal: No acute or destructive bony lesions. Reconstructed images demonstrate no additional findings. Review of the MIP images confirms the above findings. IMPRESSION: 1. No CT evidence of pulmonary embolism. 2. Multifocal bilateral ground-glass airspace disease consistent with COVID-19 pneumonia. 3. Diffuse hepatic steatosis. Electronically Signed   By: Randa Ngo M.D.   On: 05/12/2019 17:31        Scheduled Meds: . vitamin C  500 mg Oral Daily  . dextromethorphan-guaiFENesin  1 tablet Oral BID  . enoxaparin (LOVENOX) injection  40 mg Subcutaneous Q24H  . ipratropium  2 puff Inhalation Q4H  . losartan  50 mg Oral Daily  . melatonin  5 mg Oral QHS  . methylPREDNISolone (SOLU-MEDROL) injection  60 mg Intravenous Q12H  . multivitamin  with minerals  1 tablet Oral Daily  . pyridOXINE  50 mg Oral Daily  . vitamin B-12  1,000 mcg Oral Daily  . zinc sulfate  220 mg Oral Daily   Continuous Infusions: . remdesivir 100 mg in NS 100 mL Stopped (05/14/19 0939)     LOS: 2 days    Time spent: 35 min    Sidney Ace, MD [facility] Pager [phone]  If 7PM-7AM, please contact night-coverage 05/14/2019, 11:39 AM

## 2019-05-15 LAB — COMPREHENSIVE METABOLIC PANEL
ALT: 255 U/L — ABNORMAL HIGH (ref 0–44)
AST: 238 U/L — ABNORMAL HIGH (ref 15–41)
Albumin: 3.2 g/dL — ABNORMAL LOW (ref 3.5–5.0)
Alkaline Phosphatase: 74 U/L (ref 38–126)
Anion gap: 10 (ref 5–15)
BUN: 36 mg/dL — ABNORMAL HIGH (ref 6–20)
CO2: 25 mmol/L (ref 22–32)
Calcium: 8.7 mg/dL — ABNORMAL LOW (ref 8.9–10.3)
Chloride: 104 mmol/L (ref 98–111)
Creatinine, Ser: 0.81 mg/dL (ref 0.61–1.24)
GFR calc Af Amer: >60 mL/min (ref >60)
GFR calc non Af Amer: >60 mL/min (ref >60)
Glucose, Bld: 150 mg/dL — ABNORMAL HIGH (ref 70–99)
Potassium: 4.7 mmol/L (ref 3.5–5.1)
Sodium: 139 mmol/L (ref 135–145)
Total Bilirubin: 0.6 mg/dL (ref 0.3–1.2)
Total Protein: 6.5 g/dL (ref 6.5–8.1)

## 2019-05-15 LAB — CBC WITH DIFFERENTIAL/PLATELET
Abs Immature Granulocytes: 0.07 10*3/uL (ref 0.00–0.07)
Basophils Absolute: 0 10*3/uL (ref 0.0–0.1)
Basophils Relative: 0 %
Eosinophils Absolute: 0 10*3/uL (ref 0.0–0.5)
Eosinophils Relative: 0 %
HCT: 39.3 % (ref 39.0–52.0)
Hemoglobin: 12.9 g/dL — ABNORMAL LOW (ref 13.0–17.0)
Immature Granulocytes: 1 %
Lymphocytes Relative: 10 %
Lymphs Abs: 0.9 10*3/uL (ref 0.7–4.0)
MCH: 28.2 pg (ref 26.0–34.0)
MCHC: 32.8 g/dL (ref 30.0–36.0)
MCV: 86 fL (ref 80.0–100.0)
Monocytes Absolute: 0.5 10*3/uL (ref 0.1–1.0)
Monocytes Relative: 6 %
Neutro Abs: 7.1 10*3/uL (ref 1.7–7.7)
Neutrophils Relative %: 83 %
Platelets: 346 10*3/uL (ref 150–400)
RBC: 4.57 MIL/uL (ref 4.22–5.81)
RDW: 13.2 % (ref 11.5–15.5)
WBC: 8.6 10*3/uL (ref 4.0–10.5)
nRBC: 0 % (ref 0.0–0.2)

## 2019-05-15 LAB — FERRITIN: Ferritin: 416 ng/mL — ABNORMAL HIGH (ref 24–336)

## 2019-05-15 LAB — C-REACTIVE PROTEIN: CRP: 0.9 mg/dL (ref <1.0)

## 2019-05-15 LAB — FIBRIN DERIVATIVES D-DIMER (ARMC ONLY): Fibrin derivatives D-dimer (ARMC): 940.88 ng/mL (FEU) — ABNORMAL HIGH (ref 0.00–499.00)

## 2019-05-15 MED ORDER — GUAIFENESIN-DM 100-10 MG/5ML PO SYRP
5.0000 mL | ORAL_SOLUTION | ORAL | Status: DC | PRN
  Administered 2019-05-15: 21:00:00 5 mL via ORAL
  Filled 2019-05-15: qty 5

## 2019-05-15 NOTE — Progress Notes (Signed)
PROGRESS NOTE    Derrick Henderson  [id number] DOB: 01/06/1966 DOA: 05/12/2019 PCP: [place] Pink, MD   Brief Narrative:  HPI: Derrick Henderson is a 54 y.o. male with medical history significant of hypertension, hyperlipidemia, OSA on CPAP, rheumatoid arthritis, who presents with shortness of breath.  Patient states that he had positive COVID-19 test last week in PCPs office.  Since then he has been having cough, shortness of breath which has been progressively worsening.  Patient has a subjective fever and chills.  He reports that he has a frontal chest pain, which is a constant, sharp, 9 out of 10 in severity, pleuritic, aggravated by deep breath and coughing.  Patient does not have nausea vomiting, diarrhea or abdominal pain.  No symptoms of UTI or unilateral weakness.  Patient states that he has history of rheumatoid arthritis in his shoulders.  4/24: Patient seems and examined.  No acute status changes.  Remains on 8 L high flow nasal cannula.  States pleuritic chest pain is slightly improving.  4/25: Patient seen and examined.  Oxygen status improving.  Titrated down to 6 L high flow nasal cannula.  No complaint of pleuritic chest pain this morning.  No fevers noted over interval.  4/26: Patient seen and examined.  Oxygen status continues to improve.  I was able to titrate patient off to room air at time of my evaluation this morning.  No complaints of pleuritic chest pain.  No fevers noted over interval.  Kidney function stable.  Liver function worsening  Assessment & Plan:   Principal Problem:   Pneumonia due to COVID-19 virus Active Problems:   HTN (hypertension)   OSA (obstructive sleep apnea)   Acute respiratory failure with hypoxia (HCC)   Chest pain  Acute respiratory failure with hypoxia due to pneumonia due to COVID-19 virus: Elevated LFTs Patient symptoms started approximately week prior to presentation Positive test 2 weeks prior to presentation Elevated CRP  on admission Cultures no growth to date Status post Actemra infusion Titrated to 2 L.  I was able to titrate to room air at time of my evaluation patient did not desaturate. Hepatic function worsening over interval.  Suspect medication effect Plan: Hold remdesivir, patient received 3 of 5 doses Continue steroids, day 4 of 10 Vitamin supplementation Trend inflammatory markers Prone as tolerated Titrate down oxygen as tolerated Repeat CMP in the morning.  Further work-up if liver function continues to worsen Out of bed to chair, ambulate Patient remained stable on room air liver function continues to improve can consider discharge on 05/16/2019  HTN:  -Continue home medications: Cozaar -hydralazine prn  OSA (obstructive sleep apnea) Home CPAP  Chest pain:  Patient has severe pleuritic chest pain, which is most likely due to Covid pneumonia.  PE ruled out -As needed morphine for chest pain -As needed Percocet Improved   DVT prophylaxis: Lovenox Code Status: Full code Family Communication: Wife via phone [phone] on 05/14/2019 Disposition Plan:Status is: Inpatient  Remains inpatient appropriate because:Inpatient level of care appropriate due to severity of illness   Dispo: The patient is from: Home              Anticipated d/c is to: Home              Anticipated d/c date is: 1 day              Patient currently is not medically stable to d/c.  Patient's oxygen status has been improving substantially over the past  48 hours.  Liver function test worsen.  Possible remdesivir effect.  Remdesivir on hold.  Will repeat CMP in the morning.  Ambulate out of bed to chair today.  If patient remains stable on room air can consider discharge on 05/16/2019.  Consultants:   None   Procedures:   none  Antimicrobials:   Remdesivir   Subjective: Seen and examined Pleuritic chest pain improved Oxygen saturations improving  Objective: Vitals:   05/15/19 0243 05/15/19  0400 05/15/19 0753 05/15/19 0800  BP:  126/80  128/65  Pulse: 73 74  (!) 50  Resp: 18 20    Temp:    98 F (36.7 C)  TempSrc:    Oral  SpO2: 100% 100% 95% 100%  Weight:      Height:        Intake/Output Summary (Last 24 hours) at 05/15/2019 1132 Last data filed at 05/15/2019 0500 Gross per 24 hour  Intake --  Output 1700 ml  Net -1700 ml   Filed Weights   05/12/19 1110  Weight: 90.7 kg    Examination:  General exam: Appears calm and comfortable  Respiratory system: Diffuse bilateral crackles, normal work of breathing Cardiovascular system: S1 & S2 heard, RRR. No JVD, murmurs, rubs, gallops or clicks. No pedal edema. Gastrointestinal system: Abdomen is nondistended, soft and nontender. No organomegaly or masses felt. Normal bowel sounds heard. Central nervous system: Alert and oriented. No focal neurological deficits. Extremities: Symmetric 5 x 5 power. Skin: No rashes, lesions or ulcers Psychiatry: Judgement and insight appear normal. Mood & affect appropriate.     Data Reviewed: I have personally reviewed following labs and imaging studies  CBC: Recent Labs  Lab 05/12/19 1127 05/13/19 0622 05/14/19 0638 05/15/19 0603  WBC 5.9 5.6 5.5 8.6  NEUTROABS 4.7 4.6 4.4 7.1  HGB 14.0 12.8* 12.3* 12.9*  HCT 43.3 38.4* 37.5* 39.3  MCV 86.3 85.3 86.4 86.0  PLT 228 241 266 [id number]   Basic Metabolic Panel: Recent Labs  Lab 05/12/19 1127 05/13/19 0622 05/14/19 0638 05/15/19 0603  NA 139 137 139 139  K 4.2 4.4 4.4 4.7  CL 105 105 106 104  CO2 22 23 25 25   GLUCOSE 137* 143* 156* 150*  BUN 25* 27* 30* 36*  CREATININE 0.92 0.81 0.78 0.81  CALCIUM 8.4* 8.5* 8.6* 8.7*   GFR: Estimated Creatinine Clearance: 121.6 mL/min (by C-G formula based on SCr of 0.81 mg/dL). Liver Function Tests: Recent Labs  Lab 05/12/19 1127 05/13/19 0622 05/14/19 [id number] 05/15/19 0603  AST 68* 66* 88* 238*  ALT 44 42 59* 255*  ALKPHOS 79 74 68 74  BILITOT 0.9 0.8 0.6 0.6  PROT 7.0 6.7 6.5  6.5  ALBUMIN 3.5 3.2* 3.1* 3.2*   No results for input(s): LIPASE, AMYLASE in the last 168 hours. No results for input(s): AMMONIA in the last 168 hours. Coagulation Profile: Recent Labs  Lab 05/12/19 1652  INR 1.0   Cardiac Enzymes: No results for input(s): CKTOTAL, CKMB, CKMBINDEX, TROPONINI in the last 168 hours. BNP (last 3 results) No results for input(s): PROBNP in the last 8760 hours. HbA1C: No results for input(s): HGBA1C in the last 72 hours. CBG: No results for input(s): GLUCAP in the last 168 hours. Lipid Profile: Recent Labs    05/12/19 1736  TRIG 142   Thyroid Function Tests: No results for input(s): TSH, T4TOTAL, FREET4, T3FREE, THYROIDAB in the last 72 hours. Anemia Panel: Recent Labs    05/14/19 0638 05/15/19 0603  FERRITIN 294  416*   Sepsis Labs: Recent Labs  Lab 05/12/19 1736  PROCALCITON 0.12    Recent Results (from the past 240 hour(s))  Respiratory Panel by RT PCR (Flu A&B, Covid) - Nasopharyngeal Swab     Status: Abnormal   Collection Time: 05/12/19  2:36 PM   Specimen: Nasopharyngeal Swab  Result Value Ref Range Status   SARS Coronavirus 2 by RT PCR POSITIVE (A) NEGATIVE Final    Comment: RESULT CALLED TO, READ BACK BY AND VERIFIED WITH: ALLY YOW AT 1549 05/12/19.PMF (NOTE) SARS-CoV-2 target nucleic acids are DETECTED. SARS-CoV-2 RNA is generally detectable in upper respiratory specimens  during the acute phase of infection. Positive results are indicative of the presence of the identified virus, but do not rule out bacterial infection or co-infection with other pathogens not detected by the test. Clinical correlation with patient history and other diagnostic information is necessary to determine patient infection status. The expected result is Negative. Fact Sheet for Patients:  PinkCheek.be Fact Sheet for Healthcare Providers: GravelBags.it This test is not yet approved or  cleared by the [place] FDA and  has been authorized for detection and/or diagnosis of SARS-CoV-2 by FDA under an Emergency Use Authorization (EUA).  This EUA will remain in effect (meaning this test can be used) for the  duration of  the COVID-19 declaration under Section 564(b)(1) of the Act, 21 U.S.C. section 360bbb-3(b)(1), unless the authorization is terminated or revoked sooner.    Influenza A by PCR NEGATIVE NEGATIVE Final   Influenza B by PCR NEGATIVE NEGATIVE Final    Comment: (NOTE) The Xpert Xpress SARS-CoV-2/FLU/RSV assay is intended as an aid in  the diagnosis of influenza from Nasopharyngeal swab specimens and  should not be used as a sole basis for treatment. Nasal washings and  aspirates are unacceptable for Xpert Xpress SARS-CoV-2/FLU/RSV  testing. Fact Sheet for Patients: PinkCheek.be Fact Sheet for Healthcare Providers: GravelBags.it This test is not yet approved or cleared by the [place] FDA and  has been authorized for detection and/or diagnosis of SARS-CoV-2 by  FDA under an Emergency Use Authorization (EUA). This EUA will remain  in effect (meaning this test can be used) for the duration of the  Covid-19 declaration under Section 564(b)(1) of the Act, 21  U.S.C. section 360bbb-3(b)(1), unless the authorization is  terminated or revoked. Performed at [facility], [address] [place], [place] [address]   Culture, blood (Routine X 2) w Reflex to ID Panel     Status: None (Preliminary result)   Collection Time: 05/12/19  5:35 PM   Specimen: BLOOD  Result Value Ref Range Status   Specimen Description BLOOD BLOOD RIGHT HAND  Final   Special Requests   Final    BOTTLES DRAWN AEROBIC AND ANAEROBIC Blood Culture adequate volume   Culture   Final    NO GROWTH 3 DAYS Performed at [facility], [address], [place], [place] [address]    Report Status PENDING  Incomplete    Culture, blood (Routine X 2) w Reflex to ID Panel     Status: None (Preliminary result)   Collection Time: 05/12/19  5:36 PM   Specimen: BLOOD  Result Value Ref Range Status   Specimen Description BLOOD RIGHT ANTECUBITAL  Final   Special Requests   Final    BOTTLES DRAWN AEROBIC AND ANAEROBIC Blood Culture adequate volume   Culture   Final    NO GROWTH 3 DAYS Performed at [facility], [place]  [address] [place], [place] [address]    Report Status PENDING  Incomplete         Radiology Studies: No results found.      Scheduled Meds: . vitamin C  500 mg Oral Daily  . dextromethorphan-guaiFENesin  1 tablet Oral BID  . enoxaparin (LOVENOX) injection  40 mg Subcutaneous Q24H  . ipratropium  2 puff Inhalation Q4H  . losartan  50 mg Oral Daily  . melatonin  5 mg Oral QHS  . methylPREDNISolone (SOLU-MEDROL) injection  60 mg Intravenous Q12H  . multivitamin with minerals  1 tablet Oral Daily  . pyridOXINE  50 mg Oral Daily  . vitamin B-12  1,000 mcg Oral Daily  . zinc sulfate  220 mg Oral Daily   Continuous Infusions: . remdesivir 100 mg in NS 100 mL 100 mg (05/15/19 0846)     LOS: 3 days    Time spent: 35 min    Sidney Ace, MD [facility] Pager [phone]  If 7PM-7AM, please contact night-coverage 05/15/2019, 11:32 AM

## 2019-05-16 LAB — COMPREHENSIVE METABOLIC PANEL
ALT: 178 U/L — ABNORMAL HIGH (ref 0–44)
AST: 81 U/L — ABNORMAL HIGH (ref 15–41)
Albumin: 3.3 g/dL — ABNORMAL LOW (ref 3.5–5.0)
Alkaline Phosphatase: 73 U/L (ref 38–126)
Anion gap: 7 (ref 5–15)
BUN: 32 mg/dL — ABNORMAL HIGH (ref 6–20)
CO2: 25 mmol/L (ref 22–32)
Calcium: 8.6 mg/dL — ABNORMAL LOW (ref 8.9–10.3)
Chloride: 106 mmol/L (ref 98–111)
Creatinine, Ser: 0.87 mg/dL (ref 0.61–1.24)
GFR calc Af Amer: >60 mL/min (ref >60)
GFR calc non Af Amer: >60 mL/min (ref >60)
Glucose, Bld: 127 mg/dL — ABNORMAL HIGH (ref 70–99)
Potassium: 4.7 mmol/L (ref 3.5–5.1)
Sodium: 138 mmol/L (ref 135–145)
Total Bilirubin: 0.9 mg/dL (ref 0.3–1.2)
Total Protein: 6.4 g/dL — ABNORMAL LOW (ref 6.5–8.1)

## 2019-05-16 LAB — CBC WITH DIFFERENTIAL/PLATELET
Abs Immature Granulocytes: 0.12 10*3/uL — ABNORMAL HIGH (ref 0.00–0.07)
Basophils Absolute: 0 10*3/uL (ref 0.0–0.1)
Basophils Relative: 0 %
Eosinophils Absolute: 0 10*3/uL (ref 0.0–0.5)
Eosinophils Relative: 0 %
HCT: 38.8 % — ABNORMAL LOW (ref 39.0–52.0)
Hemoglobin: 12.7 g/dL — ABNORMAL LOW (ref 13.0–17.0)
Immature Granulocytes: 1 %
Lymphocytes Relative: 12 %
Lymphs Abs: 1.5 10*3/uL (ref 0.7–4.0)
MCH: 28.3 pg (ref 26.0–34.0)
MCHC: 32.7 g/dL (ref 30.0–36.0)
MCV: 86.6 fL (ref 80.0–100.0)
Monocytes Absolute: 0.9 10*3/uL (ref 0.1–1.0)
Monocytes Relative: 7 %
Neutro Abs: 9.8 10*3/uL — ABNORMAL HIGH (ref 1.7–7.7)
Neutrophils Relative %: 80 %
Platelets: 383 10*3/uL (ref 150–400)
RBC: 4.48 MIL/uL (ref 4.22–5.81)
RDW: 13 % (ref 11.5–15.5)
WBC: 12.3 10*3/uL — ABNORMAL HIGH (ref 4.0–10.5)
nRBC: 0 % (ref 0.0–0.2)

## 2019-05-16 LAB — FIBRIN DERIVATIVES D-DIMER (ARMC ONLY): Fibrin derivatives D-dimer (ARMC): 962.32 ng/mL (FEU) — ABNORMAL HIGH (ref 0.00–499.00)

## 2019-05-16 LAB — FERRITIN: Ferritin: 201 ng/mL (ref 24–336)

## 2019-05-16 LAB — C-REACTIVE PROTEIN: CRP: 0.6 mg/dL (ref <1.0)

## 2019-05-16 MED ORDER — ASCORBIC ACID 500 MG PO TABS: 500.0000 mg | ORAL_TABLET | Freq: Every day | ORAL | 0 refills | Status: AC

## 2019-05-16 MED ORDER — DEXAMETHASONE 6 MG PO TABS
6.0000 mg | ORAL_TABLET | Freq: Every day | ORAL | 0 refills | Status: AC
Start: 2019-05-16 — End: 2019-05-23

## 2019-05-16 MED ORDER — ZINC SULFATE 220 (50 ZN) MG PO CAPS: 220.0000 mg | ORAL_CAPSULE | Freq: Every day | ORAL | 0 refills | Status: AC

## 2019-05-16 MED ORDER — SODIUM CHLORIDE 0.9 % IV SOLN
100.0000 mg | Freq: Every day | INTRAVENOUS | Status: AC
  Administered 2019-05-16: 100 mg via INTRAVENOUS
  Filled 2019-05-16: qty 20

## 2019-05-16 NOTE — Discharge Summary (Signed)
Physician Discharge Summary  MIKHI MATHY [id number] DOB: [birth year] DOA: 05/12/2019  PCP: Maryland Pink, MD  Admit date: 05/12/2019 Discharge date: 05/16/2019  Admitted From: Home Disposition: Home  Recommendations for Outpatient Follow-up:  1. Follow up with PCP in 1-2 weeks 2.   Home Health: No Equipment/Devices: Oxygen, 2 L  discharge Condition: Stable CODE STATUS: Full Diet recommendation: Heart Healthy  Brief/Interim Summary: [id number] W Thompsonis a 54 y.o.malewith medical history significant ofhypertension, hyperlipidemia, OSA on CPAP,rheumatoid arthritis,who presents with shortness of breath.  Patient states that he had positive COVID-19 test last week in PCPs office. Since then he has been having cough, shortness of breath which has been progressively worsening. Patient has a subjective fever and chills. He reports that he has a frontal chest pain, which is a constant, sharp, 9 out of 10 in severity, pleuritic, aggravated by deep breath and coughing. Patient does not have nausea vomiting, diarrhea or abdominal pain. No symptoms of UTI or unilateral weakness. Patient states that he has history of rheumatoid arthritis in his shoulders.  4/24: Patient seems and examined.  No acute status changes.  Remains on 8 L high flow nasal cannula.  States pleuritic chest pain is slightly improving.  4/25: Patient seen and examined.  Oxygen status improving.  Titrated down to 6 L high flow nasal cannula.  No complaint of pleuritic chest pain this morning.  No fevers noted over interval.  4/26: Patient seen and examined.  Oxygen status continues to improve.  I was able to titrate patient off to room air at time of my evaluation this morning.  No complaints of pleuritic chest pain.  No fevers noted over interval.  Kidney function stable.  Liver function worsening  4/27: Patient seen and examined.  Clinically improved.  Still dependent on 2 L nasal cannula.  Ambulated  and found to desaturate.  Required 2 to 3 L to regain saturations.  DME oxygen prescribed.  Liver function normalized.  Kidney function stable.  No fevers noted over interval.  Medically stable for discharge at this time.   Discharge Diagnoses:  Principal Problem:   Pneumonia due to COVID-19 virus Active Problems:   HTN (hypertension)   OSA (obstructive sleep apnea)   Acute respiratory failure with hypoxia (HCC)   Chest pain  Acute respiratory failure with hypoxiadue to pneumonia due to COVID-19 virus: Elevated LFTs Patient symptoms started approximately week prior to presentation Positive test 2 weeks prior to presentation Elevated CRP on admission Cultures no growth to date Status post Actemra infusion Titrated to 2 L.  I was able to titrate to room air at time of my evaluation patient did not desaturate. Hepatic function worsening over interval.  Suspect medication effect 1 dose of remdesivir held for day Liver function normalized Patient received last dose of remdesivir in house Remain dependent on 2 L nasal cannula DME oxygen prescribed Complete 10-day course of steroids as outpatient Outpatient PCP follow-up  HTN:  -Continue home medications:Cozaar   OSA (obstructive sleep apnea) Home CPAP  Chest pain: Patient has severe pleuritic chest pain, whichismost likely due to Covid pneumonia.  PE ruled out -As needed morphine for chest pain -As needed Percocet Improved  Discharge Instructions  Discharge Instructions    Diet - low sodium heart healthy   Complete by: As directed    Increase activity slowly   Complete by: As directed    MyChart COVID-19 home monitoring program   Complete by: May 16, 2019    Is the patient willing  to use the [place] for home monitoring?: Yes   Temperature monitoring   Complete by: May 16, 2019    After how many days would you like to receive a notification of this patient's flowsheet entries?: 1     Allergies as  of 05/16/2019      Reactions   Aspirin Hives      Medication List    TAKE these medications   acetaminophen 500 MG tablet Commonly known as: TYLENOL Take by mouth. Take 1,500 mg by mouth as needed for pain   ascorbic acid 500 MG tablet Commonly known as: VITAMIN C Take 1 tablet (500 mg total) by mouth daily for 14 days. Start taking on: May 17, 2019   co-enzyme Q-10 30 MG capsule Take by mouth daily.   dexamethasone 6 MG tablet Commonly known as: Decadron Take 1 tablet (6 mg total) by mouth daily for 7 days.   losartan 50 MG tablet Commonly known as: COZAAR Take 50 mg by mouth daily.   multivitamin tablet Take 1 tablet by mouth daily.   naproxen 500 MG tablet Commonly known as: NAPROSYN Take 500 mg by mouth 2 (two) times daily with a meal.   pyridOXINE 50 MG tablet Commonly known as: B-6 Take 50 mg by mouth daily.   SELENIUM PO Take by mouth daily.   vitamin B-12 1000 MCG tablet Commonly known as: CYANOCOBALAMIN Take 1,000 mcg by mouth daily.   zinc sulfate 220 (50 Zn) MG capsule Take 1 capsule (220 mg total) by mouth daily for 14 days. Start taking on: May 17, 2019            Durable Medical Equipment  (From admission, onward)         Start     Ordered   05/16/19 0944  For home use only DME oxygen  Once    Question Answer Comment  Length of Need 6 Months   Mode or (Route) Nasal cannula   Liters per Minute 2   Oxygen delivery system Gas      05/16/19 0943         Follow-up Information    Maryland Pink, MD. Schedule an appointment as soon as possible for a visit in 1 week(s).   Specialty: Family Medicine Contact information: [address] [place] [address] [phone]          Allergies  Allergen Reactions  . Aspirin Hives    Consultations:  none   Procedures/Studies: DG Chest 2 View  Result Date: 05/12/2019 CLINICAL DATA:  Non smoker, +COVId last week, now with increasing SOB EXAM: CHEST -  2 VIEW COMPARISON:  Chest radiograph 08/13/2011 FINDINGS: The heart size and mediastinal contours are within normal limits for AP technique. Low lung volumes. There are diffuse bilateral pulmonary infiltrates. No pneumothorax or pleural effusion. No acute osseous finding in the visualized skeleton. IMPRESSION: Diffuse bilateral pulmonary infiltrates concerning for multifocal pneumonia. Electronically Signed   By: Audie Pinto M.D.   On: 05/12/2019 12:23   CT ANGIO CHEST PE W OR WO CONTRAST  Result Date: 05/12/2019 CLINICAL DATA:  Recent positive COVID 19 test, chest pain, cough, short of breath EXAM: CT ANGIOGRAPHY CHEST WITH CONTRAST TECHNIQUE: Multidetector CT imaging of the chest was performed using the standard protocol during bolus administration of intravenous contrast. Multiplanar CT image reconstructions and MIPs were obtained to evaluate the vascular anatomy. CONTRAST:  51mL OMNIPAQUE IOHEXOL 350 MG/ML SOLN COMPARISON:  05/12/2019 FINDINGS: Cardiovascular: This  is a technically adequate evaluation of the pulmonary vasculature. No filling defects or pulmonary emboli. The heart is unremarkable without pericardial effusion. Thoracic aorta is normal without aneurysm or dissection. Mediastinum/Nodes: No enlarged mediastinal, hilar, or axillary lymph nodes. Thyroid gland, trachea, and esophagus demonstrate no significant findings. Lungs/Pleura: Multifocal bilateral ground-glass airspace disease consistent with COVID-19 pneumonia. No effusion or pneumothorax. The central airways are patent. Upper Abdomen: There is diffuse hepatic steatosis. Otherwise the upper abdomen is unremarkable. Musculoskeletal: No acute or destructive bony lesions. Reconstructed images demonstrate no additional findings. Review of the MIP images confirms the above findings. IMPRESSION: 1. No CT evidence of pulmonary embolism. 2. Multifocal bilateral ground-glass airspace disease consistent with COVID-19 pneumonia. 3. Diffuse  hepatic steatosis. Electronically Signed   By: Randa Ngo M.D.   On: 05/12/2019 17:31    (Echo, Carotid, EGD, Colonoscopy, ERCP)    Subjective: Seen and examined on the day of discharge No complaints, feels well Stable for discharge home at this time  Discharge Exam: Vitals:   05/16/19 0754 05/16/19 1121  BP: 131/74   Pulse: 69 78  Resp: 16 (!) 21  Temp: 98.5 F (36.9 C)   SpO2: 99% 94%   Vitals:   05/15/19 1532 05/16/19 0141 05/16/19 0754 05/16/19 1121  BP: 131/79 (!) 111/48 131/74   Pulse: 88 72 69 78  Resp: 20 18 16  (!) 21  Temp: 98.1 F (36.7 C) 98.8 F (37.1 C) 98.5 F (36.9 C)   TempSrc:  Axillary    SpO2: 98% 98% 99% 94%  Weight:      Height:        General: Pt is alert, awake, not in acute distress Cardiovascular: RRR, S1/S2 +, no rubs, no gallops Respiratory: CTA bilaterally, no wheezing, no rhonchi Abdominal: Soft, NT, ND, bowel sounds + Extremities: no edema, no cyanosis    The results of significant diagnostics from this hospitalization (including imaging, microbiology, ancillary and laboratory) are listed below for reference.     Microbiology: Recent Results (from the past 240 hour(s))  Respiratory Panel by RT PCR (Flu A&B, Covid) - Nasopharyngeal Swab     Status: Abnormal   Collection Time: 05/12/19  2:36 PM   Specimen: Nasopharyngeal Swab  Result Value Ref Range Status   SARS Coronavirus 2 by RT PCR POSITIVE (A) NEGATIVE Final    Comment: RESULT CALLED TO, READ BACK BY AND VERIFIED WITH: ALLY YOW AT 1549 05/12/19.PMF (NOTE) SARS-CoV-2 target nucleic acids are DETECTED. SARS-CoV-2 RNA is generally detectable in upper respiratory specimens  during the acute phase of infection. Positive results are indicative of the presence of the identified virus, but do not rule out bacterial infection or co-infection with other pathogens not detected by the test. Clinical correlation with patient history and other diagnostic information is necessary to  determine patient infection status. The expected result is Negative. Fact Sheet for Patients:  PinkCheek.be Fact Sheet for Healthcare Providers: GravelBags.it This test is not yet approved or cleared by the [place] FDA and  has been authorized for detection and/or diagnosis of SARS-CoV-2 by FDA under an Emergency Use Authorization (EUA).  This EUA will remain in effect (meaning this test can be used) for the  duration of  the COVID-19 declaration under Section 564(b)(1) of the Act, 21 U.S.C. section 360bbb-3(b)(1), unless the authorization is terminated or revoked sooner.    Influenza A by PCR NEGATIVE NEGATIVE Final   Influenza B by PCR NEGATIVE NEGATIVE Final    Comment: (NOTE) The Xpert Xpress  SARS-CoV-2/FLU/RSV assay is intended as an aid in  the diagnosis of influenza from Nasopharyngeal swab specimens and  should not be used as a sole basis for treatment. Nasal washings and  aspirates are unacceptable for Xpert Xpress SARS-CoV-2/FLU/RSV  testing. Fact Sheet for Patients: PinkCheek.be Fact Sheet for Healthcare Providers: GravelBags.it This test is not yet approved or cleared by the [place] FDA and  has been authorized for detection and/or diagnosis of SARS-CoV-2 by  FDA under an Emergency Use Authorization (EUA). This EUA will remain  in effect (meaning this test can be used) for the duration of the  Covid-19 declaration under Section 564(b)(1) of the Act, 21  U.S.C. section 360bbb-3(b)(1), unless the authorization is  terminated or revoked. Performed at [facility], [address] [place], [place] [address]   Culture, blood (Routine X 2) w Reflex to ID Panel     Status: None (Preliminary result)   Collection Time: 05/12/19  5:35 PM   Specimen: BLOOD  Result Value Ref Range Status   Specimen Description BLOOD BLOOD RIGHT HAND  Final    Special Requests   Final    BOTTLES DRAWN AEROBIC AND ANAEROBIC Blood Culture adequate volume   Culture   Final    NO GROWTH 4 DAYS Performed at [facility], [address] [place], [place] [address]    Report Status PENDING  Incomplete  Culture, blood (Routine X 2) w Reflex to ID Panel     Status: None (Preliminary result)   Collection Time: 05/12/19  5:36 PM   Specimen: BLOOD  Result Value Ref Range Status   Specimen Description BLOOD RIGHT ANTECUBITAL  Final   Special Requests   Final    BOTTLES DRAWN AEROBIC AND ANAEROBIC Blood Culture adequate volume   Culture   Final    NO GROWTH 4 DAYS Performed at [facility], [address] [place], [place] [address]    Report Status PENDING  Incomplete     Labs: BNP (last 3 results) Recent Labs    05/12/19 1736  BNP [id number]   Basic Metabolic Panel: Recent Labs  Lab 05/12/19 1127 05/13/19 0622 05/14/19 0638 05/15/19 0603 05/16/19 0621  NA 139 137 139 139 138  K 4.2 4.4 4.4 4.7 4.7  CL 105 105 106 104 106  CO2 22 23 25 25 25   GLUCOSE 137* 143* 156* 150* 127*  BUN 25* 27* 30* 36* 32*  CREATININE 0.92 0.81 0.78 0.81 0.87  CALCIUM 8.4* 8.5* 8.6* 8.7* 8.6*   Liver Function Tests: Recent Labs  Lab 05/12/19 1127 05/13/19 0622 05/14/19 0638 05/15/19 0603 05/16/19 0621  AST 68* 66* 88* 238* 81*  ALT 44 42 59* 255* 178*  ALKPHOS 79 74 68 74 73  BILITOT 0.9 0.8 0.6 0.6 0.9  PROT 7.0 6.7 6.5 6.5 6.4*  ALBUMIN 3.5 3.2* 3.1* 3.2* 3.3*   No results for input(s): LIPASE, AMYLASE in the last 168 hours. No results for input(s): AMMONIA in the last 168 hours. CBC: Recent Labs  Lab 05/12/19 1127 05/13/19 0622 05/14/19 [id number] 05/15/19 0603 05/16/19 0621  WBC 5.9 5.6 5.5 8.6 12.3*  NEUTROABS 4.7 4.6 4.4 7.1 9.8*  HGB 14.0 12.8* 12.3* 12.9* 12.7*  HCT 43.3 38.4* 37.5* 39.3 38.8*  MCV 86.3 85.3 86.4 86.0 86.6  PLT 228 241 266 346 383   Cardiac Enzymes: No results for input(s): CKTOTAL, CKMB, CKMBINDEX,  TROPONINI in the last 168 hours. BNP: Invalid input(s): POCBNP CBG: No results for input(s): GLUCAP  in the last 168 hours. D-Dimer No results for input(s): DDIMER in the last 72 hours. Hgb A1c No results for input(s): HGBA1C in the last 72 hours. Lipid Profile No results for input(s): CHOL, HDL, LDLCALC, TRIG, CHOLHDL, LDLDIRECT in the last 72 hours. Thyroid function studies No results for input(s): TSH, T4TOTAL, T3FREE, THYROIDAB in the last 72 hours.  Invalid input(s): FREET3 Anemia work up [employer]    05/15/19 0603 05/16/19 0621  FERRITIN 416* 201   Urinalysis No results found for: COLORURINE, APPEARANCEUR, LABSPEC, [place], GLUCOSEU, HGBUR, BILIRUBINUR, KETONESUR, PROTEINUR, UROBILINOGEN, NITRITE, LEUKOCYTESUR Sepsis Labs Invalid input(s): PROCALCITONIN,  WBC,  LACTICIDVEN Microbiology Recent Results (from the past 240 hour(s))  Respiratory Panel by RT PCR (Flu A&B, Covid) - Nasopharyngeal Swab     Status: Abnormal   Collection Time: 05/12/19  2:36 PM   Specimen: Nasopharyngeal Swab  Result Value Ref Range Status   SARS Coronavirus 2 by RT PCR POSITIVE (A) NEGATIVE Final    Comment: RESULT CALLED TO, READ BACK BY AND VERIFIED WITH: ALLY YOW AT 1549 05/12/19.PMF (NOTE) SARS-CoV-2 target nucleic acids are DETECTED. SARS-CoV-2 RNA is generally detectable in upper respiratory specimens  during the acute phase of infection. Positive results are indicative of the presence of the identified virus, but do not rule out bacterial infection or co-infection with other pathogens not detected by the test. Clinical correlation with patient history and other diagnostic information is necessary to determine patient infection status. The expected result is Negative. Fact Sheet for Patients:  PinkCheek.be Fact Sheet for Healthcare Providers: GravelBags.it This test is not yet approved or cleared by the [place] FDA and   has been authorized for detection and/or diagnosis of SARS-CoV-2 by FDA under an Emergency Use Authorization (EUA).  This EUA will remain in effect (meaning this test can be used) for the  duration of  the COVID-19 declaration under Section 564(b)(1) of the Act, 21 U.S.C. section 360bbb-3(b)(1), unless the authorization is terminated or revoked sooner.    Influenza A by PCR NEGATIVE NEGATIVE Final   Influenza B by PCR NEGATIVE NEGATIVE Final    Comment: (NOTE) The Xpert Xpress SARS-CoV-2/FLU/RSV assay is intended as an aid in  the diagnosis of influenza from Nasopharyngeal swab specimens and  should not be used as a sole basis for treatment. Nasal washings and  aspirates are unacceptable for Xpert Xpress SARS-CoV-2/FLU/RSV  testing. Fact Sheet for Patients: PinkCheek.be Fact Sheet for Healthcare Providers: GravelBags.it This test is not yet approved or cleared by the [place] FDA and  has been authorized for detection and/or diagnosis of SARS-CoV-2 by  FDA under an Emergency Use Authorization (EUA). This EUA will remain  in effect (meaning this test can be used) for the duration of the  Covid-19 declaration under Section 564(b)(1) of the Act, 21  U.S.C. section 360bbb-3(b)(1), unless the authorization is  terminated or revoked. Performed at [facility], [address] [place], [place] [address]   Culture, blood (Routine X 2) w Reflex to ID Panel     Status: None (Preliminary result)   Collection Time: 05/12/19  5:35 PM   Specimen: BLOOD  Result Value Ref Range Status   Specimen Description BLOOD BLOOD RIGHT HAND  Final   Special Requests   Final    BOTTLES DRAWN AEROBIC AND ANAEROBIC Blood Culture adequate volume   Culture   Final    NO GROWTH 4 DAYS Performed at [facility], [address] [place],  [address]  Report Status PENDING  Incomplete  Culture, blood (Routine X 2) w  Reflex to ID Panel     Status: None (Preliminary result)   Collection Time: 05/12/19  5:36 PM   Specimen: BLOOD  Result Value Ref Range Status   Specimen Description BLOOD RIGHT ANTECUBITAL  Final   Special Requests   Final    BOTTLES DRAWN AEROBIC AND ANAEROBIC Blood Culture adequate volume   Culture   Final    NO GROWTH 4 DAYS Performed at [facility], [address], [place], [place] [address]    Report Status PENDING  Incomplete     Time coordinating discharge: Over 30 minutes  SIGNED:   Sidney Ace, MD  [facility] 05/16/2019, 12:44 PM Pager   If 7PM-7AM, please contact night-coverage

## 2019-05-16 NOTE — TOC Transition Note (Signed)
Transition of Care [facility]) - CM/SW Discharge Note   Patient Details  Name: Derrick Henderson MRN: [id number] Date of Birth: 02-Oct-1965  [facility]) CM/SW Contact:  Shelbie Hutching, RN Phone Number: 05/16/2019, 11:38 AM   Clinical Narrative:    Patient medically cleared for discharge home today.  Patient will need home oxygen.  Brad with [employer] has been contacted and given DME order for home O2.  [facility] will deliver oxygen to the patient's room before discharge.    Final next level of care: Home/Self Care Barriers to Discharge: Barriers Resolved   Patient Goals and CMS Choice   CMS Medicare.gov Compare Post Acute Care list provided to:: Patient Choice offered to / list presented to : Patient  Discharge Placement                       Discharge Plan and Services   Discharge Planning Services: CM Consult Post Acute Care Choice: Durable Medical Equipment          DME Arranged: Oxygen DME Agency: [employer] Date DME Agency Contacted: 05/16/19 Time DME Agency Contacted: 1100 Representative spoke with at DME Agency: [place] Determinants of Health ([place]) Interventions     Readmission Risk Interventions No flowsheet data found.

## 2019-05-16 NOTE — Progress Notes (Signed)
SATURATION QUALIFICATIONS: (This note is used to comply with regulatory documentation for home oxygen)  Patient Saturations on Room Air at Rest = 86%  Patient Saturations on Room Air while Ambulating = 82%  Patient Saturations on 3 Liters of oxygen while Ambulating = 94%  Please briefly explain why patient needs home oxygen:  Pt oxygen saturation dropped while ambulating on room air requiring the patient to need oxygen via nasal cannula.

## 2019-05-16 NOTE — Discharge Instructions (Signed)
 COVID-19 COVID-19 is a respiratory infection that is caused by a virus called severe acute respiratory syndrome coronavirus 2 (SARS-CoV-2). The disease is also known as coronavirus disease or novel coronavirus. In some people, the virus may not cause any symptoms. In others, it may cause a serious infection. The infection can get worse quickly and can lead to complications, such as:  Pneumonia, or infection of the lungs.  Acute respiratory distress syndrome or ARDS. This is a condition in which fluid build-up in the lungs prevents the lungs from filling with air and passing oxygen into the blood.  Acute respiratory failure. This is a condition in which there is not enough oxygen passing from the lungs to the body or when carbon dioxide is not passing from the lungs out of the body.  Sepsis or septic shock. This is a serious bodily reaction to an infection.  Blood clotting problems.  Secondary infections due to bacteria or fungus.  Organ failure. This is when your body's organs stop working. The virus that causes COVID-19 is contagious. This means that it can spread from person to person through droplets from coughs and sneezes (respiratory secretions). What are the causes? This illness is caused by a virus. You may catch the virus by:  Breathing in droplets from an infected person. Droplets can be spread by a person breathing, speaking, singing, coughing, or sneezing.  Touching something, like a table or a doorknob, that was exposed to the virus (contaminated) and then touching your mouth, nose, or eyes. What increases the risk? Risk for infection You are more likely to be infected with this virus if you:  Are within 6 feet (2 meters) of a person with COVID-19.  Provide care for or live with a person who is infected with COVID-19.  Spend time in crowded indoor spaces or live in shared housing. Risk for serious illness You are more likely to become seriously ill from the virus if  you:  Are 50 years of age or older. The higher your age, the more you are at risk for serious illness.  Live in a nursing home or long-term care facility.  Have cancer.  Have a long-term (chronic) disease such as: ? Chronic lung disease, including chronic obstructive pulmonary disease or asthma. ? A long-term disease that lowers your body's ability to fight infection (immunocompromised). ? Heart disease, including heart failure, a condition in which the arteries that lead to the heart become narrow or blocked (coronary artery disease), a disease which makes the heart muscle thick, weak, or stiff (cardiomyopathy). ? Diabetes. ? Chronic kidney disease. ? Sickle cell disease, a condition in which red blood cells have an abnormal "sickle" shape. ? Liver disease.  Are obese. What are the signs or symptoms? Symptoms of this condition can range from mild to severe. Symptoms may appear any time from 2 to 14 days after being exposed to the virus. They include:  A fever or chills.  A cough.  Difficulty breathing.  Headaches, body aches, or muscle aches.  Runny or stuffy (congested) nose.  A sore throat.  New loss of taste or smell. Some people may also have stomach problems, such as nausea, vomiting, or diarrhea. Other people may not have any symptoms of COVID-19. How is this diagnosed? This condition may be diagnosed based on:  Your signs and symptoms, especially if: ? You live in an area with a COVID-19 outbreak. ? You recently traveled to or from an area where the virus is common. ?   You provide care for or live with a person who was diagnosed with COVID-19. ? You were exposed to a person who was diagnosed with COVID-19.  A physical exam.  Lab tests, which may include: ? Taking a sample of fluid from the back of your nose and throat (nasopharyngeal fluid), your nose, or your throat using a swab. ? A sample of mucus from your lungs (sputum). ? Blood tests.  Imaging tests,  which may include, X-rays, CT scan, or ultrasound. How is this treated? At present, there is no medicine to treat COVID-19. Medicines that treat other diseases are being used on a trial basis to see if they are effective against COVID-19. Your health care provider will talk with you about ways to treat your symptoms. For most people, the infection is mild and can be managed at home with rest, fluids, and over-the-counter medicines. Treatment for a serious infection usually takes places in a hospital intensive care unit (ICU). It may include one or more of the following treatments. These treatments are given until your symptoms improve.  Receiving fluids and medicines through an IV.  Supplemental oxygen. Extra oxygen is given through a tube in the nose, a face mask, or a hood.  Positioning you to lie on your stomach (prone position). This makes it easier for oxygen to get into the lungs.  Continuous positive airway pressure (CPAP) or bi-level positive airway pressure (BPAP) machine. This treatment uses mild air pressure to keep the airways open. A tube that is connected to a motor delivers oxygen to the body.  Ventilator. This treatment moves air into and out of the lungs by using a tube that is placed in your windpipe.  Tracheostomy. This is a procedure to create a hole in the neck so that a breathing tube can be inserted.  Extracorporeal membrane oxygenation (ECMO). This procedure gives the lungs a chance to recover by taking over the functions of the heart and lungs. It supplies oxygen to the body and removes carbon dioxide. Follow these instructions at home: Lifestyle  If you are sick, stay home except to get medical care. Your health care provider will tell you how long to stay home. Call your health care provider before you go for medical care.  Rest at home as told by your health care provider.  Do not use any products that contain nicotine or tobacco, such as cigarettes,  e-cigarettes, and chewing tobacco. If you need help quitting, ask your health care provider.  Return to your normal activities as told by your health care provider. Ask your health care provider what activities are safe for you. General instructions  Take over-the-counter and prescription medicines only as told by your health care provider.  Drink enough fluid to keep your urine pale yellow.  Keep all follow-up visits as told by your health care provider. This is important. How is this prevented?  There is no vaccine to help prevent COVID-19 infection. However, there are steps you can take to protect yourself and others from this virus. To protect yourself:   Do not travel to areas where COVID-19 is a risk. The areas where COVID-19 is reported change often. To identify high-risk areas and travel restrictions, check the CDC travel website: wwwnc.cdc.gov/travel/notices  If you live in, or must travel to, an area where COVID-19 is a risk, take precautions to avoid infection. ? Stay away from people who are sick. ? Wash your hands often with soap and water for 20 seconds. If soap and   water are not available, use an alcohol-based hand sanitizer. ? Avoid touching your mouth, face, eyes, or nose. ? Avoid going out in public, follow guidance from your state and local health authorities. ? If you must go out in public, wear a cloth face covering or face mask. Make sure your mask covers your nose and mouth. ? Avoid crowded indoor spaces. Stay at least 6 feet (2 meters) away from others. ? Disinfect objects and surfaces that are frequently touched every day. This may include:  Counters and tables.  Doorknobs and light switches.  Sinks and faucets.  Electronics, such as phones, remote controls, keyboards, computers, and tablets. To protect others: If you have symptoms of COVID-19, take steps to prevent the virus from spreading to others.  If you think you have a COVID-19 infection, contact  your health care provider right away. Tell your health care team that you think you may have a COVID-19 infection.  Stay home. Leave your house only to seek medical care. Do not use public transport.  Do not travel while you are sick.  Wash your hands often with soap and water for 20 seconds. If soap and water are not available, use alcohol-based hand sanitizer.  Stay away from other members of your household. Let healthy household members care for children and pets, if possible. If you have to care for children or pets, wash your hands often and wear a mask. If possible, stay in your own room, separate from others. Use a different bathroom.  Make sure that all people in your household wash their hands well and often.  Cough or sneeze into a tissue or your sleeve or elbow. Do not cough or sneeze into your hand or into the air.  Wear a cloth face covering or face mask. Make sure your mask covers your nose and mouth. Where to find more information  Centers for Disease Control and Prevention: www.cdc.gov/coronavirus/2019-ncov/index.html  World Health Organization: www.who.int/health-topics/coronavirus Contact a health care provider if:  You live in or have traveled to an area where COVID-19 is a risk and you have symptoms of the infection.  You have had contact with someone who has COVID-19 and you have symptoms of the infection. Get help right away if:  You have trouble breathing.  You have pain or pressure in your chest.  You have confusion.  You have bluish lips and fingernails.  You have difficulty waking from sleep.  You have symptoms that get worse. These symptoms may represent a serious problem that is an emergency. Do not wait to see if the symptoms will go away. Get medical help right away. Call your local emergency services (911 in the [place] Do not drive yourself to the hospital. Let the emergency medical personnel know if you think you have  COVID-19. Summary  COVID-19 is a respiratory infection that is caused by a virus. It is also known as coronavirus disease or novel coronavirus. It can cause serious infections, such as pneumonia, acute respiratory distress syndrome, acute respiratory failure, or sepsis.  The virus that causes COVID-19 is contagious. This means that it can spread from person to person through droplets from breathing, speaking, singing, coughing, or sneezing.  You are more likely to develop a serious illness if you are 50 years of age or older, have a weak immune system, live in a nursing home, or have chronic disease.  There is no medicine to treat COVID-19. Your health care provider will talk with you about ways to treat your   Take steps to protect yourself and others from infection. Wash your hands often and disinfect objects and surfaces that are frequently touched every day. Stay away from people who are sick and wear a mask if you are sick. This information is not intended to replace advice given to you by your health care provider. Make sure you discuss any questions you have with your health care provider. Document Revised: 11/04/2018 Document Reviewed: 02/10/2018 Elsevier Patient Education  2020 [employer] 10 Things You Can Do to Manage Your COVID-19 Symptoms at Home If you have possible or confirmed COVID-19: 1. Stay home from work and school. And stay away from other public places. If you must go out, avoid using any kind of public transportation, ridesharing, or taxis. 2. Monitor your symptoms carefully. If your symptoms get worse, call your healthcare provider immediately. 3. Get rest and stay hydrated. 4. If you have a medical appointment, call the healthcare provider ahead of time and tell them that you have or may have COVID-19. 5. For medical emergencies, call 911 and notify the dispatch personnel that you have or may have COVID-19. 6. Cover your cough and sneezes with a tissue or use the inside of your elbow. 7.  Wash your hands often with soap and water for at least 20 seconds or clean your hands with an alcohol-based hand sanitizer that contains at least 60% alcohol. 8. As much as possible, stay in a specific room and away from other people in your home. Also, you should use a separate bathroom, if available. If you need to be around other people in or outside of the home, wear a mask. 9. Avoid sharing personal items with other people in your household, like dishes, towels, and bedding. 10. Clean all surfaces that are touched often, like counters, tabletops, and doorknobs. Use household cleaning sprays or wipes according to the label instructions. cdc.gov/coronavirus 07/20/2018 This information is not intended to replace advice given to you by your health care provider. Make sure you discuss any questions you have with your health care provider. Document Revised: 12/22/2018 Document Reviewed: 12/22/2018 Elsevier Patient Education  2020 Elsevier Inc.  

## 2019-05-17 LAB — CULTURE, BLOOD (ROUTINE X 2)
Culture: NO GROWTH
Culture: NO GROWTH
Special Requests: ADEQUATE
Special Requests: ADEQUATE

## 2020-03-18 ENCOUNTER — Telehealth: Payer: Self-pay

## 2020-03-18 ENCOUNTER — Ambulatory Visit
Admission: RE | Admit: 2020-03-18 | Discharge: 2020-03-18 | Disposition: A | Discharge status: Home / Self Care | Payer: BC Managed Care – PPO | Source: Ambulatory Visit | Attending: Urology | Admitting: Urology

## 2020-03-18 ENCOUNTER — Encounter: Payer: Self-pay | Admitting: Urology

## 2020-03-18 ENCOUNTER — Ambulatory Visit: Payer: BC Managed Care – PPO | Admitting: Urology

## 2020-03-18 ENCOUNTER — Other Ambulatory Visit: Payer: Self-pay

## 2020-03-18 VITALS — BP 136/81 | HR 93 | Ht 71.0 in | Wt 226.0 lb

## 2020-03-18 DIAGNOSIS — R310 Gross hematuria: Secondary | ICD-10-CM

## 2020-03-18 DIAGNOSIS — R1032 Left lower quadrant pain: Secondary | ICD-10-CM

## 2020-03-18 NOTE — Progress Notes (Signed)
   03/18/20 1:48 PM   Derrick Henderson 1965/08/06 [id number]  CC: Suspected kidney stone  HPI: I saw Derrick Henderson for suspected kidney stone.  He is a 55 year old male with a history of kidney stones previously treated with ureteroscopy and shockwave lithotripsy by Derrick Henderson in the distant past who presents with 1 week of malaise, nausea, and left-sided flank pain.  Urinalysis with PCP showed > 50 RBCs, but no evidence of infection.  A KUB was performed, but those results or imaging are not available to me.  He states this feels very similar to prior stone episodes.  He denies any fevers.  He also is having some urinary symptoms of urgency and frequency.  He thinks he had uric acid stones in the past.  Urinalysis today benign with 0-5 WBCs, 0-2 RBCs, few bacteria, no yeast, nitrite negative, no leukocytes.   PMH: Past Medical History:  Diagnosis Date  . Biceps tendinitis   . Colon polyp   . Headache    migraine  . Hyperlipidemia   . Renal calculi 04/26/2013  . Sleep apnea    uses CPAP    Surgical History: Past Surgical History:  Procedure Laterality Date  . COLONOSCOPY WITH PROPOFOL N/A 09/20/2015   Procedure: COLONOSCOPY WITH PROPOFOL;  Surgeon: Derrick Silvas, MD;  Location: [facility] ENDOSCOPY;  Service: Endoscopy;  Laterality: N/A;  . COLONOSCOPY WITH PROPOFOL N/A 11/30/2018   Procedure: COLONOSCOPY WITH PROPOFOL;  Surgeon: Toledo, Benay Pike, MD;  Location: [facility] ENDOSCOPY;  Service: Gastroenterology;  Laterality: N/A;  . LITHOTRIPSY    . URETEROSCOPY WITH HOLMIUM LASER LITHOTRIPSY      Social History:  reports that he has never smoked. He has never used smokeless tobacco. He reports that he does not drink alcohol and does not use drugs.  Physical Exam: BP 136/81   Pulse 93   Ht 5\' 11"  (1.803 m)   Wt 226 lb (102.5 kg)   BMI 31.52 kg/m    Constitutional:  Alert and oriented, No acute distress. Cardiovascular: No clubbing, cyanosis, or edema. Respiratory: Normal  respiratory effort, no increased work of breathing. GI: Abdomen is soft, nontender, nondistended, no abdominal masses GU: Left CVA tenderness  Laboratory Data: Reviewed, see HPI  Pertinent Imaging: I have personally viewed and interpreted the stat CT that was ordered today showing no hydronephrosis and no ureteral stones, however there is a large central calcification in the prostate, unclear if this represents a prostatic urethral stone versus intraprostatic calcification.  Assessment & Plan:   55 year old male with 1 week of malaise, nausea, left flank pain, and significant microscopic hematuria.  A stat CT scan today showed no ureteral stones or hydronephrosis, but there is a centrally located prostatic calcification that could potentially represent a lodged urethral stone.  However, I would suspect his urinalysis would show some microscopic hematuria or pyuria, and it is completely benign today.  He may have recently passed a ureteral stone.  I recommended scheduling him for a cystoscopy on Thursday to evaluate for a stone lodged in the urethra.  If his symptoms completely resolve over the next 48 hours and he is not having any dysuria, urinary symptoms, or abdominal pain, okay to cancel cystoscopy.   Derrick Madrid, MD 03/18/2020  [facility] [address] [employer] [place], [place] [address] [phone]

## 2020-03-18 NOTE — Telephone Encounter (Signed)
-----   Message from Billey Co, MD sent at 03/18/2020  3:20 PM EST ----- No ureteral or kidney stones seen.  He may have passed his stone.  There is also a calcification in the prostate, and this could potentially represent a stone that has passed in the bladder and is now lodged in the prostate.  Would recommend scheduling cystoscopy in clinic on Thursday to make sure this is not a stone causing blockage in the prostate, if he is feeling well and symptoms have completely resolved by then okay to cancel  [name], MD 03/18/2020

## 2020-03-18 NOTE — Telephone Encounter (Signed)
Patient notified and scheduled 

## 2020-03-19 ENCOUNTER — Telehealth: Payer: Self-pay | Admitting: Urology

## 2020-03-19 LAB — URINALYSIS, COMPLETE
Bilirubin, UA: NEGATIVE
Glucose, UA: NEGATIVE
Ketones, UA: NEGATIVE
Leukocytes,UA: NEGATIVE
Nitrite, UA: NEGATIVE
Protein,UA: NEGATIVE
RBC, UA: NEGATIVE
Specific Gravity, UA: 1.025 (ref 1.005–1.030)
Urobilinogen, Ur: 2 mg/dL — ABNORMAL HIGH (ref 0.2–1.0)
pH, UA: 7 (ref 5.0–7.5)

## 2020-03-19 LAB — MICROSCOPIC EXAMINATION

## 2020-03-19 NOTE — Telephone Encounter (Signed)
Yes, can cancel and schedule one year with KUB, thanks  [name], MD 03/19/2020

## 2020-03-19 NOTE — Telephone Encounter (Signed)
Patient called to let you know that he passed his stone this morning. He wants to know if he will need to keep his follow up app?   Sharyn Lull

## 2020-03-19 NOTE — Telephone Encounter (Signed)
I called pt and scheduled a 1 yr follow up w/KUB prior.    Pt also would like a call back with a question.

## 2020-03-20 NOTE — Telephone Encounter (Signed)
Perfect, thanks  [name], MD 03/20/2020

## 2020-03-20 NOTE — Telephone Encounter (Signed)
Called pt he questions of prostate calcification is an indicator of prostate cancer. Advised pt of Dr. Doristine Counter result note in regards to imaging states that calcification is likely stone that has passed down into bladder and now lodged in prostate. Additionally advised pt that DRE and PSA trend is a much better indicator of prostate cancer and the need for further testing such as a prostate biopsy. Advised pt of his last several PSAs which are all in normal range. Pt voiced understanding.

## 2020-03-21 ENCOUNTER — Other Ambulatory Visit: Payer: Self-pay | Admitting: Urology

## 2020-03-21 ENCOUNTER — Ambulatory Visit: Payer: Self-pay | Admitting: Urology

## 2021-02-06 ENCOUNTER — Ambulatory Visit
Admission: RE | Admit: 2021-02-06 | Discharge: 2021-02-06 | Disposition: A | Discharge status: Home / Self Care | Payer: 59 | Source: Ambulatory Visit | Attending: Family Medicine | Admitting: Family Medicine

## 2021-03-20 ENCOUNTER — Ambulatory Visit: Payer: BC Managed Care – PPO | Admitting: Urology

## 2021-03-20 ENCOUNTER — Encounter: Payer: Self-pay | Admitting: Urology

## 2021-03-20 ENCOUNTER — Other Ambulatory Visit: Payer: Self-pay | Admitting: *Deleted

## 2021-03-20 DIAGNOSIS — N200 Calculus of kidney: Secondary | ICD-10-CM

## 2023-07-09 ENCOUNTER — Other Ambulatory Visit: Payer: Self-pay | Admitting: Nurse Practitioner

## 2023-07-09 ENCOUNTER — Ambulatory Visit
Admission: RE | Admit: 2023-07-09 | Discharge: 2023-07-09 | Disposition: A | Discharge status: Home / Self Care | Source: Ambulatory Visit | Attending: Nurse Practitioner | Admitting: Nurse Practitioner

## 2023-07-09 DIAGNOSIS — M79605 Pain in left leg: Secondary | ICD-10-CM

## 2023-09-10 ENCOUNTER — Other Ambulatory Visit: Payer: Self-pay | Admitting: Sports Medicine

## 2023-09-10 DIAGNOSIS — M47816 Spondylosis without myelopathy or radiculopathy, lumbar region: Secondary | ICD-10-CM

## 2023-09-10 DIAGNOSIS — M51362 Other intervertebral disc degeneration, lumbar region with discogenic back pain and lower extremity pain: Secondary | ICD-10-CM

## 2023-09-10 DIAGNOSIS — G8929 Other chronic pain: Secondary | ICD-10-CM

## 2023-09-10 DIAGNOSIS — M5416 Radiculopathy, lumbar region: Secondary | ICD-10-CM

## 2023-09-18 ENCOUNTER — Ambulatory Visit
Admission: RE | Admit: 2023-09-18 | Discharge: 2023-09-18 | Disposition: A | Discharge status: Home / Self Care | Source: Ambulatory Visit | Attending: Sports Medicine | Admitting: Sports Medicine

## 2023-09-18 DIAGNOSIS — M51362 Other intervertebral disc degeneration, lumbar region with discogenic back pain and lower extremity pain: Secondary | ICD-10-CM

## 2023-09-18 DIAGNOSIS — M5416 Radiculopathy, lumbar region: Secondary | ICD-10-CM

## 2023-09-18 DIAGNOSIS — G8929 Other chronic pain: Secondary | ICD-10-CM

## 2023-09-18 DIAGNOSIS — M47816 Spondylosis without myelopathy or radiculopathy, lumbar region: Secondary | ICD-10-CM
